# Patient Record
Sex: Female | Born: 1985 | Race: White | Hispanic: No | Marital: Married | Smoking: Current every day smoker
Health system: Southern US, Community
[De-identification: ages and names within clinical notes are randomized; demographics above are authoritative.]

## PROBLEM LIST (undated history)

## (undated) DIAGNOSIS — O1490 Unspecified pre-eclampsia, unspecified trimester: Secondary | ICD-10-CM

## (undated) DIAGNOSIS — Z9851 Tubal ligation status: Secondary | ICD-10-CM

## (undated) HISTORY — PX: TUBAL LIGATION: SHX77

---

## 2006-05-27 ENCOUNTER — Emergency Department: Payer: Self-pay | Admitting: General Practice

## 2008-08-04 ENCOUNTER — Emergency Department: Payer: Self-pay | Admitting: Emergency Medicine

## 2008-10-08 ENCOUNTER — Emergency Department: Payer: Self-pay | Admitting: Emergency Medicine

## 2009-12-08 ENCOUNTER — Emergency Department: Payer: Self-pay | Admitting: Emergency Medicine

## 2010-11-05 ENCOUNTER — Ambulatory Visit: Payer: Self-pay | Admitting: Advanced Practice Midwife

## 2011-02-25 ENCOUNTER — Emergency Department: Payer: Self-pay | Admitting: Emergency Medicine

## 2011-04-15 ENCOUNTER — Inpatient Hospital Stay: Payer: Self-pay

## 2011-04-15 LAB — PIH PROFILE
Anion Gap: 13 (ref 7–16)
BUN: 9 mg/dL (ref 7–18)
Chloride: 109 mmol/L — ABNORMAL HIGH (ref 98–107)
Creatinine: 0.64 mg/dL (ref 0.60–1.30)
EGFR (Non-African Amer.): >60
HGB: 12.3 g/dL (ref 12.0–16.0)
MCH: 30.2 pg (ref 26.0–34.0)
MCHC: 34 g/dL (ref 32.0–36.0)
Osmolality: 282 (ref 275–301)
Platelet: 198 10*3/uL (ref 150–440)
Sodium: 143 mmol/L (ref 136–145)
Uric Acid: 4.6 mg/dL (ref 2.6–6.0)

## 2011-04-15 LAB — PROTEIN / CREATININE RATIO, URINE
Creatinine, Urine: 137.9 mg/dL — ABNORMAL HIGH (ref 30.0–125.0)
Protein, Random Urine: 44 mg/dL — ABNORMAL HIGH (ref 0–12)

## 2011-04-21 LAB — PATHOLOGY REPORT

## 2012-03-06 ENCOUNTER — Encounter: Payer: Self-pay | Admitting: Obstetrics & Gynecology

## 2012-04-17 ENCOUNTER — Encounter: Payer: Self-pay | Admitting: Maternal and Fetal Medicine

## 2012-05-11 ENCOUNTER — Encounter: Payer: Self-pay | Admitting: Obstetrics and Gynecology

## 2012-06-22 ENCOUNTER — Encounter: Payer: Self-pay | Admitting: Maternal and Fetal Medicine

## 2012-09-10 ENCOUNTER — Observation Stay: Payer: Self-pay | Admitting: Obstetrics and Gynecology

## 2012-09-11 ENCOUNTER — Inpatient Hospital Stay: Payer: Self-pay

## 2012-09-11 LAB — CBC WITH DIFFERENTIAL/PLATELET
Basophil #: 0.1 10*3/uL (ref 0.0–0.1)
Basophil %: 0.9 %
Eosinophil #: 0 10*3/uL (ref 0.0–0.7)
Eosinophil %: 0.1 %
HCT: 33.2 % — ABNORMAL LOW (ref 35.0–47.0)
HGB: 11.6 g/dL — ABNORMAL LOW (ref 12.0–16.0)
Lymphocyte #: 1.6 10*3/uL (ref 1.0–3.6)
Lymphocyte %: 9.7 %
MCH: 29.3 pg (ref 26.0–34.0)
Monocyte %: 3.5 %
Neutrophil #: 13.8 10*3/uL — ABNORMAL HIGH (ref 1.4–6.5)
Neutrophil %: 85.8 %
Platelet: 215 10*3/uL (ref 150–440)
RBC: 3.95 10*6/uL (ref 3.80–5.20)
RDW: 13.3 % (ref 11.5–14.5)
WBC: 16.1 10*3/uL — ABNORMAL HIGH (ref 3.6–11.0)

## 2012-09-12 LAB — HEMATOCRIT: HCT: 28.5 % — ABNORMAL LOW (ref 35.0–47.0)

## 2012-09-12 LAB — PATHOLOGY REPORT

## 2012-10-11 ENCOUNTER — Emergency Department: Payer: Self-pay | Admitting: Emergency Medicine

## 2012-10-11 LAB — COMPREHENSIVE METABOLIC PANEL
Albumin: 3.7 g/dL (ref 3.4–5.0)
Anion Gap: 8 (ref 7–16)
BUN: 13 mg/dL (ref 7–18)
Bilirubin,Total: 0.3 mg/dL (ref 0.2–1.0)
Calcium, Total: 8.9 mg/dL (ref 8.5–10.1)
Chloride: 110 mmol/L — ABNORMAL HIGH (ref 98–107)
Co2: 27 mmol/L (ref 21–32)
EGFR (African American): >60
Potassium: 3.8 mmol/L (ref 3.5–5.1)
SGPT (ALT): 45 U/L (ref 12–78)
Total Protein: 7.2 g/dL (ref 6.4–8.2)

## 2012-10-11 LAB — URINALYSIS, COMPLETE
Bilirubin,UR: NEGATIVE
Glucose,UR: NEGATIVE mg/dL (ref 0–75)
Ketone: NEGATIVE
Nitrite: NEGATIVE
Ph: 5 (ref 4.5–8.0)
Specific Gravity: 1.024 (ref 1.003–1.030)
Squamous Epithelial: 30
WBC UR: 11 /HPF (ref 0–5)

## 2012-10-11 LAB — CBC
MCH: 27.5 pg (ref 26.0–34.0)
MCHC: 32.7 g/dL (ref 32.0–36.0)
RBC: 4.5 10*6/uL (ref 3.80–5.20)
WBC: 7.8 10*3/uL (ref 3.6–11.0)

## 2012-10-11 LAB — LIPASE, BLOOD: Lipase: 172 U/L (ref 73–393)

## 2012-10-12 LAB — WET PREP, GENITAL

## 2012-10-12 LAB — GC/CHLAMYDIA PROBE AMP

## 2012-10-20 ENCOUNTER — Emergency Department: Payer: Self-pay | Admitting: Emergency Medicine

## 2013-01-28 ENCOUNTER — Emergency Department: Payer: Self-pay | Admitting: Emergency Medicine

## 2013-01-28 LAB — COMPREHENSIVE METABOLIC PANEL
Alkaline Phosphatase: 98 U/L (ref 50–136)
BUN: 12 mg/dL (ref 7–18)
Co2: 18 mmol/L — ABNORMAL LOW (ref 21–32)
EGFR (Non-African Amer.): >60
Potassium: 3.3 mmol/L — ABNORMAL LOW (ref 3.5–5.1)
Total Protein: 6.9 g/dL (ref 6.4–8.2)

## 2013-01-28 LAB — CBC
HGB: 12.6 g/dL (ref 12.0–16.0)
MCV: 80 fL (ref 80–100)
Platelet: 206 10*3/uL (ref 150–440)
RBC: 4.48 10*6/uL (ref 3.80–5.20)
RDW: 14.5 % (ref 11.5–14.5)

## 2013-03-01 ENCOUNTER — Emergency Department: Payer: Self-pay | Admitting: Internal Medicine

## 2013-03-11 ENCOUNTER — Emergency Department: Payer: Self-pay | Admitting: Emergency Medicine

## 2013-03-30 ENCOUNTER — Emergency Department: Payer: Self-pay | Admitting: Emergency Medicine

## 2013-03-30 LAB — CBC
HCT: 38.6 % (ref 35.0–47.0)
HGB: 12.9 g/dL (ref 12.0–16.0)
MCH: 27.6 pg (ref 26.0–34.0)
MCHC: 33.5 g/dL (ref 32.0–36.0)
MCV: 82 fL (ref 80–100)
RBC: 4.68 10*6/uL (ref 3.80–5.20)
WBC: 8.8 10*3/uL (ref 3.6–11.0)

## 2013-03-30 LAB — COMPREHENSIVE METABOLIC PANEL
BUN: 15 mg/dL (ref 7–18)
Co2: 22 mmol/L (ref 21–32)
Creatinine: 0.86 mg/dL (ref 0.60–1.30)
EGFR (African American): >60
Glucose: 110 mg/dL — ABNORMAL HIGH (ref 65–99)
Osmolality: 285 (ref 275–301)
Potassium: 3.7 mmol/L (ref 3.5–5.1)
SGOT(AST): 23 U/L (ref 15–37)
SGPT (ALT): 22 U/L (ref 12–78)

## 2013-04-28 ENCOUNTER — Emergency Department: Payer: Self-pay | Admitting: Emergency Medicine

## 2013-07-18 ENCOUNTER — Emergency Department: Payer: Self-pay | Admitting: Emergency Medicine

## 2013-07-18 LAB — URINALYSIS, COMPLETE
SPECIFIC GRAVITY: 1.025 (ref 1.003–1.030)
Squamous Epithelial: NONE SEEN
WBC UR: 381 /HPF (ref 0–5)

## 2013-07-20 LAB — URINE CULTURE

## 2013-09-19 ENCOUNTER — Emergency Department: Payer: Self-pay | Admitting: Emergency Medicine

## 2013-11-20 ENCOUNTER — Emergency Department: Payer: Self-pay | Admitting: Emergency Medicine

## 2014-07-30 NOTE — Consult Note (Signed)
Referral Information:   Reason for Referral Prior pregnancy complicated by fetal growth restriction and term preeclampsia    Referring Physician [facility]    Prenatal Hx 29 year-old G3 P1011 at 74 1/7 weeks presents for genetic counseling, MFM consult and first trimester ultrasound. She declined first trimester screening. See separately dicated genetic counseling note and ultrasound report.  Marie Fowler most recent pregnancy was complicated by preeclampsia and fetal growth restriction. She states that she was found to be hypertensive during a routine ob appoitment at 37 weeks. She was sent to L&D and was found to have proteinuria (P/C 319 mg) and diagnosed with preeclampsia. She did not have HELLP syndrome. She underwent a labor induction, received magnesium prophylaxis and eventually required a cesarean delivery for arrest of dilation (documented low-transverse uterine incision).  Her delivery and post-operative course was uncomplicated. The birthweight was 5 pounds 6 ounces.  This pregnancy has been uncomplicated. She denies abdominal pain, vaginal bleeding or cramping.    Past Obstetrical Hx G3 P1011: 2009: Elective abortion at 6 weeks, D&C, no complications 2013: C-section at 37 weeks, arrest of dilation in setting of preeclampsia induction, BW 5 pounds 6 ounces   Home Medications: Medication Instructions Status  Prenatal 1 oral capsule 1 cap(s) orally once a day Active   Allergies:   Penicillin: Unknown  Vital Signs/Notes:  Nursing Vital Signs: **Vital Signs.:   25-Nov-13 13:07   Pulse Pulse 74   Systolic BP Systolic BP 127   Diastolic BP (mmHg) Diastolic BP (mmHg) 62   Perinatal Consult:   PGyn Hx Denies history of abnormal paps or STDS    Past Medical History cont'd Denies history of chronic hypertension, diabetes or thyroid disorders    PSurg Hx D&C for elective first trimester abortion, C-section    FHx See genetic counseling note    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 No edema, abdmoninal pain, cramping, vaginal bleeding.    Fever/Chills No    SOB/DOE No    Chest Pain No    Tolerating Diet Yes    Medications/Allergies Reviewed Medications/Allergies reviewed   [place]:    25-Nov-13 13:46, MFM OB US < 14 Wks, Single Fetus   MFM OB US < 14 Wks, Single Fetus    Indication: History of preeclampsia, Obesity, Close interconceptual spacing,  Exposures.   ____________________________________________________________________________  History: Age: 66 years.  ____________________________________________________________________________  Dating:  LMP:     12/12/2011     EDC:     09/17/2012     GA by LMP:     [redacted]w[redacted]d  Current Scan on:     03/06/2012     EDC:     09/19/2012     GA by current scan:      [redacted]w[redacted]d  Best Overall Assessment:     03/06/2012     EDC:     09/17/2012     Assessed GA:      [redacted]w[redacted]d  The calculation of the gestational age by current scan was based on CRL.  The Best Overall Assessment is based on the LMP.  ____________________________________________________________________________  First Trimester Scan:  Singleton gestation.  Biometry:  CRL     53.9     mm      -     [redacted]w[redacted]d    Additional Markers for Risk Assessment: Nasal bone present.  Fetal Anatomy:  Stomach:  not visible. Bladder: visible. Hands: both visible.      Fetal heart activity: present. Fetal heart rate: 157 bpm.   Placenta: anterior.     ____________________________________________________________________________  Maternal Structures:  Uterus: normal.  Cervix: normal. Cervical length: 35 mm.  Right Ovary: not visible.   Left Ovary:   Left Ovary size: 36 mm x 15 mm x 20 mm. Volume: 5.7 ml.   Right Tube: normal.   Left Tube: normal.  ____________________________________________________________________________  Report Summary:  Impression: Ms.  Marie Fowler presents for genetic counseling, MFM consultation and  ultrasound. She declined first trimester screen.    Ultrasound demonstrates a single live pregnancy at 12 1/7 weeks. Dating is  by LMP consistent with today's scan. Visualization of fetal anatomy is limted  by early gestational age.  The left ovary is visualized and contains a single  simple cyst that measures 1.9 x 1.9 x 2.0 cm. The right ovary was not able to  be visualized.    The findings were discussed. CODING DESCRIPTION: elevated BMI, prior  pregnancy with preeclampsia.   Recommendations: Return in 6 weeks for anatomy scan.    Thank you for allowing us to participate in her care.  Electronically signed by:  [place] Larsen Zettel, MD     Additional Lab/Radiology Notes Prenatal labs ([facility], 02/16/12): Blood type A positive, antibody screen neg, RPR non-reactive, Rubella immune, Hep B neg, GC/Chl neg, Varicella immune. GBS positive (urine), HIV pending   Impression/Recommendations:   Impression 29 year-old G3 P1011 at 5212 1/7 weeks with close interval pregnancy and prior pregnancy complicated by fetal growth restriction and term preeclampsia.  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    Recommendations We discussed the recurrence rate of preeclampsia and fetal growth restriction. Her preeclampsia presented at term, and though she is at risk for recurrent preeclampsia, it is reasurring that it presented at term in her prior pregnancy. We reviewed signs and symptoms of preeclampsia.  -See separately dicated genetic counseling note. She declined first trimester screening -Return in 6 weeks for anatomy scan -Can offer daily baby aspirin (81mg )  -Recommned growth scan at approximately 28 weeks in setting of prior pregnancy complicated by fetal growth restriction -Please obatin a baseline 24 hour urine protein, LFTs, and uric acid -Patient is unsure about mode of delivery. We reviewed risks and benefits of both repeat cesrean and trial  of labor -If elects for trial of labor, will need antibiotics for GBS prophylaxis.     Total Time Spent with Patient 60 minutes    >50% of visit spent in couseling/coordination of care yes    Office Use Only 99244  Level 4 (60min) NEW office consult low complexity   Coding Description: MATERNAL CONDITIONS/HISTORY INDICATION(S).   History of prior preg w/ FGR/IUGR.   OTHER: history of prior pregnancy with preeclampsia.  Electronic Signatures: Jock Mahon, [place]had (MD)  (Signed [phone]-Nov-13 16:22)  Authored: Referral, Home Medications, Allergies, Vital Signs/Notes, Consult, Exam, Radiology, Lab/Radiology Notes, Impression, Billing, Coding Description   Last Updated: 25-Nov-13 16:22 by Alec Jaros, [place]had (MD)

## 2014-08-02 NOTE — Op Note (Signed)
PATIENT NAME:  Marie Fowler, Marie Fowler MR#:  [id number] DATE OF BIRTH:  1985-08-25  DATE OF PROCEDURE:  09/11/2012  PREOPERATIVE DIAGNOSIS:  1.  Previous cesarean section, in labor.  2.  Multiparous female desiring permanent sterilization.    POSTOPERATIVE DIAGNOSIS:  1.  Previous cesarean section, in labor. 2.  Female sterilized.   PROCEDURE: Secondary LUT cesarean section.   SURGEON: Elliot Gurneyarrie C Bethsaida Siegenthaler, MD   ESTIMATED BLOOD LOSS:  1000 mL.   FINDINGS: Term liveborn female infant. Normal uterus, tubes and ovaries. Bilateral telescoping tubes.   DESCRIPTION OF PROCEDURE: The patient was taken the operating room and placed in supine position. After adequate anesthesia was instilled, the patient was prepped and draped in the usual sterile fashion. A timeout was performed and a Pfannenstiel skin incision was made through the previous skin incision, carried sharply down to the fascia. The fascia was nicked in the midline. The incision was extended in a superolateral manner with the curved Mayo scissors. Kocher clamps were applied to the fascia and the rectus fascia and muscles were sharply and bluntly dissected superiorly. The muscle belly midline was identified and separated. The peritoneum was grasped and entered. A bladder blade was placed. A bladder flap was created. A uterine incision was made. Clear fluid was noted upon rupturing of the amniotic sac. The infant's head was delivered and bulb suctioned. The remainder of the shoulders and body were delivered. The cord was milked.  The cord was clamped and cut. The infant was handed off to the awaiting pediatrician. Pitocin was started. The placenta was delivered spontaneously, intact and checked.  The uterus was exteriorized and wrapped in a moist laparotomy sponge. The interior of the uterus was curetted with a moist lap sponge. The cervix was grasped with a Pennington on the incision edge. Chromic suture was then used for a running locked suture. A running  imbricating  suture was then placed. The bladder flap was tacked back up to the uterus with a 3-0 chromic. The abdomen was cleared of clots. First the right tube and then the left tube was grasoed and infused with marcaine. the bovie was used to open the broad ligament and the sutures were placed with plain gut and the tube was excised first iont he right and then ont he left. telescoping was seen good hemostasis was seen. The uterus was placed back into the abdomen. The gutters were cleared of clots. The muscle bellies were closed with a running Vicryl suture. The On-Q pain pump was placed. The 2 trocars were placed through the fascia. The catheters were placed wrapped around the muscle bellies. The fascia was closed with a running Vicryl suture. Subcutaneous fat was approximated with anXLH on a plain gut suture. Skin clips were placed. On-Q pain pump was primed. The catheters were wrapped around a 4 x 4. Dermabond was placed on the skin.  Tegaderm was placed over the catheters. Telfa and ABD were placed over the uterine incision. The uterus was massaged and cleared of clot. Clear urine was noted in the Foley bag. The patient was laid supine and taken to recovery after having tolerated the procedure well.    ____________________________ Elliot Gurneyarrie C. Keleigh Kazee, MD cck:cb D: 09/17/2012 14:01:00 ET T: 09/17/2012 22:11:15 ET JOB#: [id number]  cc: Elliot Gurneyarrie C. Morty Ortwein, MD, <Dictator>  Elliot GurneyARRIE C Karolynn Infantino MD ELECTRONICALLY SIGNED 09/19/2012 11:57

## 2014-08-04 NOTE — Op Note (Signed)
PATIENT NAME:  Marie PaganiniMEELER, Kyesha V MR#:  [id number] DATE OF BIRTH:  19-Jan-1986  DATE OF PROCEDURE:  04/17/2011  PREOPERATIVE DIAGNOSIS: Secondary arrest of dilation.   POSTOPERATIVE DIAGNOSES:  1. Secondary arrest of dilation.  2. Meconium.  3. IUGR. 4. Small placenta.  5. OP presentation.   PROCEDURE: Primary LUT cesarean section.   SURGEON: Elliot Gurneyarrie C. Camelia Stelzner, MD  ESTIMATED BLOOD LOSS: 1500 mL.   FINDINGS: OP female fetus weighing 5 pounds, 6 ounces, Apgars of 5 and 9. Normal uterus, tubes and ovaries with small fibroid in the posterior aspect of the uterus as well as a small cervical laceration in the left lower incision.   DESCRIPTION OF PROCEDURE: Patient was taken to Operating Room, placed in supine position. After adequate epidural and spinal anesthesia was instilled, the patient was prepped and draped in the usual sterile fashion. Pfannenstiel skin incision was marked and carried sharply down to the fascia with the scalpel. Fascia was nicked in the midline and the incision was extended in a superolateral manner with curved Mayo scissors. The superior aspect of the fascia was grasped with Kocher clamps and the rectus fascia was sharply and bluntly dissected off the rectus muscles both inferiorly and superiorly. Muscle belly midline was identified and separated with the surgeon's fingers. The peritoneum was grasped and sharply entered. A bladder blade was placed. Bladder flap was created. Uterine incision was made and the infant was delivered with the assistance of vacuum due to contracted pelvis. The fetus' head was very deeply engaged in the pelvis and very difficult to get out. It was a 5-pound, 6-ounce child. The infant was found to be decreased responsiveness and was handed immediately to the pediatrician who resuscitated the baby. Blood gas was obtained which was 7.08. The uterus was wrapped in a moist laparotomy sponge after being delivered and the placenta was delivered. The interior of the  uterus was curetted with a moist laparotomy sponge. Uterine incision was closed with a running locked chromic suture imbricating suture. The bladder flap was tacked back up to the uterine incision and the pelvis and abdomen were cleared of clots. The uterus was placed back into the abdomen. The muscle bellies were approximated. The On-Q pain pump was then placed into the superior aspect of the fascia. The trocars were placed. The catheter was then placed and wrapped around the muscle belly. The fascia was closed with running chromic suture. Plain gut suture was used to approximate the subcutaneous fat and skin clips were placed on the incision. 4 x 4s were placed under the catherters and atached with  Steri-Strips the catheter to the abdomen. A large Tegaderm was placed. The On-Q pain pump was primed. The uterus was expressed of clots. Due to the previous Foley catheter pulling out the patient's legs were butterflied and intense visualization of the vagina and urethra was identified. No tears were seen. No fistulas were identified. The urine cleared once the vagina was cleared of blood and clots. The patient was then laid supine and taken to recovery after having tolerated the procedure well.   ____________________________ Elliot Gurneyarrie C. Breea Loncar, MD cck:cms D: 04/17/2011 22:16:48 ET T: 04/18/2011 14:24:39 ET JOB#: [id number]  cc: Elliot Gurneyarrie C. Daemien Fronczak, MD, <Dictator> Elliot GurneyARRIE C Keily Lepp MD ELECTRONICALLY SIGNED 04/28/2011 16:20

## 2014-08-20 NOTE — H&P (Signed)
L&D Evaluation:  History:   HPI 29 year old G2 P0010 with EDC=04/30/2011 by a 14week6day ultrasound (CRL only?)  and EDC=04/23/2011 by unsure LMP = 07/17/2010 [facility] at 37  6/7 weeks [facility] elevated BP in clinic of 142/88 and 139/91 with +1 proteinuria. Had elevated BP on 12/27 of 130/90. Deneis visual changes, headaches, abdominal pain, CP, SOB. Had had some swelling in the lower extremities. Baby active. Denies VB or LOF. Prenatal care at [facility] begun in first trimester and remarkable for obesity (current BMI=36) with a net weight loss this pregnancy of 25#, tobacco use (decreased from 1 1/2 PPD to 5cigs/day), and +GBS. Received her TDAP 02/08/2011. LABS: APOS, RI, VI, HEPB and HIV negative.    Presents with elevated BP and +1proteinuria    Patient's Medical History No Chronic Illness    Patient's Surgical History none    Medications Pre Natal Vitamins    Allergies NKDA    Social History tobacco   ROS:   ROS see HPI   Exam:   Vital Signs 153/87, 141/81, 154/72     Urine Protein 1+    General no apparent distress    Mental Status clear    Chest wheezing in left field>right    Heart normal sinus rhythm, no murmur/gallop/rubs    Abdomen gravid, non-tender    Estimated Fetal Weight 5#12 oz per biometrics with lagging AC    Fetal Position ROT    Edema 1+    Reflexes 1+    Mebranes Intact, AFI=1.1cm in LUQ    FHT 120 with accels to 140    Ucx absent    Skin dry   Impression:   Impression IUP at 37 6/7 weeks with PIH and oligohydraminos and ?IUGR   Plan:   Plan EFM/NST, PIH panel, Consult Dr Jean RosenthalJackson regarding IOL for severe preeclampsia.    Comments Began conversation with patient regarding preeclampsia and possible complications.   Electronic Signatures: Trinna BalloonGutierrez, Carie Kapuscinski L (CNM)  (Signed 03-Jan-13 11:48)  Authored: L&D Evaluation   Last Updated: 03-Jan-13 11:48 by Trinna BalloonGutierrez, Rhonda Linan L (CNM)

## 2014-08-20 NOTE — H&P (Signed)
L&D Evaluation:  History Expanded:  HPI 29yo G3P1011 previous csection who is 38 weeks 4 days, she h as a history of severe preeclampsia, she has had GBS pos urine this pregnancy, elevated one hour OGTT, desires repeat csection, and BTL has signed papers.got tdap 06/30/12   Gravida 3   Term 1   PreTerm 0   Abortion 1   Living 1   Blood Type (Maternal) A positive   Group B Strep Results Maternal (Result >5wks must be treated as unknown) positive    Maternal HIV Negative   Maternal Syphilis Ab Nonreactive   Maternal Varicella Immune   Rubella Results (Maternal) immune   Maternal T-Dap Immune   [facility]   Presents with contractions   Patient's Medical History No Chronic Illness    Patient's Surgical History Previous C-Section    Medications Pre Natal Vitamins    Allergies NKDA   Social History tobacco    Family History Non-Contributory    Current Prenatal Course Notable For obesity ho severe preeclampsia with oligo    ROS:  ROS All systems were reviewed.  HEENT, CNS, GI, GU, Respiratory, CV, Renal and Musculoskeletal systems were found to be normal.   Exam:  Vital Signs stable    Urine Protein not completed   General no apparent distress   Mental Status clear    Chest clear    Heart normal sinus rhythm   Abdomen gravid, non-tender   Estimated Fetal Weight Average for gestational age   Fundal Height term   Back no CVAT   Edema no edema    Reflexes 1+    Pelvic no external lesions   Mebranes Intact   FHT normal rate with no decels   Ucx irregular   Skin dry   Lymph no lymphadenopathy    Impression:  Impression contractions-latent labor   Plan:  Plan UA, EFM/NST, monitor contractions and for cervical change   Electronic Signatures: Adria DevonKlett, Qunisha Bryk (MD)  (Signed 01-Jun-14 17:21)  Authored: L&D Evaluation   Last Updated: 01-Jun-14 17:21 by Adria DevonKlett, Garrick Midgley (MD)

## 2014-08-20 NOTE — H&P (Signed)
L&D Evaluation:  History Expanded:  HPI 29yo G3P1011 previous csection who is 38 weeks 5 days, she has a history of severe preeclampsia, she has had GBS pos urine this pregnancy, elevated one hour OGTT, desires repeat csection, and BTL has signed papers.got tdap 06/30/12. she was here earlier tonight but contractions had stopped per mkonitor and when she wenthome they kicked back in and she presents at 3 cm dilated and contracting every 3 minutes. for repeat Csedctiona nd BTL   Gravida 3   Term 1   PreTerm 0   Abortion 1   Living 1   Blood Type (Maternal) A positive   Group B Strep Results Maternal (Result >5wks must be treated as unknown) positive   Maternal HIV Negative   Maternal Syphilis Ab Nonreactive   Maternal Varicella Immune   Rubella Results (Maternal) immune   Maternal T-Dap Immune   [facility]   Presents with contractions   Patient's Medical History No Chronic Illness   Patient's Surgical History Previous C-Section   Medications Pre Natal Vitamins   Allergies NKDA   Social History tobacco   Family History Non-Contributory   Current Prenatal Course Notable For obesity ho severe preeclampsia with oligo   ROS:  ROS All systems were reviewed.  HEENT, CNS, GI, GU, Respiratory, CV, Renal and Musculoskeletal systems were found to be normal.   Exam:  Vital Signs stable   Urine Protein not completed   General no apparent distress   Mental Status clear   Chest clear   Heart normal sinus rhythm   Abdomen gravid, non-tender   Estimated Fetal Weight Average for gestational age   Fundal Height term   Back no CVAT   Edema no edema   Reflexes 1+   Pelvic no external lesions, 3-4 cm/100   Mebranes Intact   FHT normal rate with no decels   Ucx regular   Skin dry   Lymph no lymphadenopathy   Impression:  Impression active labor, previous csection   Plan:  Plan UA, EFM/NST, monitor contractions and for cervical change    Comments will do csection with onq pain pump and btl   Follow Up Appointment need to schedule. in 6 weeks   Electronic Signatures: Adria DevonKlett, Ariyel Jeangilles (MD)  (Signed 02-Jun-14 03:09)  Authored: L&D Evaluation   Last Updated: 02-Jun-14 03:09 by Adria DevonKlett, Jennica Tagliaferri (MD)

## 2014-12-28 ENCOUNTER — Encounter: Payer: Self-pay | Admitting: Adult Health

## 2014-12-28 DIAGNOSIS — Y998 Other external cause status: Secondary | ICD-10-CM | POA: Insufficient documentation

## 2014-12-28 DIAGNOSIS — S199XXA Unspecified injury of neck, initial encounter: Secondary | ICD-10-CM | POA: Diagnosis not present

## 2014-12-28 DIAGNOSIS — Y9389 Activity, other specified: Secondary | ICD-10-CM | POA: Insufficient documentation

## 2014-12-28 DIAGNOSIS — S0990XA Unspecified injury of head, initial encounter: Secondary | ICD-10-CM | POA: Diagnosis present

## 2014-12-28 DIAGNOSIS — S0003XA Contusion of scalp, initial encounter: Secondary | ICD-10-CM | POA: Insufficient documentation

## 2014-12-28 DIAGNOSIS — Y9289 Other specified places as the place of occurrence of the external cause: Secondary | ICD-10-CM | POA: Diagnosis not present
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
## 2014-12-29 ENCOUNTER — Emergency Department
Admission: EM | Admit: 2014-12-29 | Discharge: 2014-12-29 | Disposition: A | Discharge status: Home / Self Care | Payer: BLUE CROSS/BLUE SHIELD | Attending: Emergency Medicine | Admitting: Emergency Medicine

## 2014-12-29 ENCOUNTER — Emergency Department: Payer: BLUE CROSS/BLUE SHIELD

## 2014-12-29 DIAGNOSIS — S0003XA Contusion of scalp, initial encounter: Secondary | ICD-10-CM

## 2014-12-29 DIAGNOSIS — S0990XA Unspecified injury of head, initial encounter: Secondary | ICD-10-CM

## 2014-12-29 HISTORY — DX: Tubal ligation status: Z98.51

## 2014-12-29 NOTE — ED Notes (Signed)
Pt has returned from [place] at this time. Will continue to monitor.

## 2014-12-29 NOTE — ED Notes (Signed)
Pt has finished filing a report with [employer]; moved to family waiting room; visitor with pt
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
## 2014-12-29 NOTE — Discharge Instructions (Signed)
You were seen in the Emergency Department (ED) today for a head injury.  Based on your evaluation, you may have sustained a concussion (or bruise) to your brain.  If you had a CT scan done, it did not show any evidence of serious injury or bleeding.    Symptoms to expect from a concussion include nausea, mild to moderate headache, difficulty concentrating or sleeping, and mild lightheadedness.  These symptoms should improve over the next few days to weeks, but it may take many weeks before you feel back to normal.  Return to the emergency department or follow-up with your primary care doctor if your symptoms are not improving over this time.  Signs of a more serious head injury include vomiting, severe headache, excessive sleepiness or confusion, and weakness or numbness in your face, arms or legs.  Return immediately to the Emergency Department if you experience any of these more concerning symptoms.    Rest, avoid strenuous physical or mental activity, and avoid activities that could potentially result in another head injury until all your symptoms from this head injury are completely resolved for at least 2-3 weeks.  If you participate in sports, get cleared by your doctor or trainer before returning to play.  You may take ibuprofen or acetaminophen over the counter according to label instructions for mild headache or scalp soreness.    Facial or Scalp Contusion A facial or scalp contusion is a deep bruise on the face or head. Injuries to the face and head generally cause a lot of swelling, especially around the eyes. Contusions are the result of an injury that caused bleeding under the skin. The contusion may turn blue, purple, or yellow. Minor injuries will give you a painless contusion, but more severe contusions may stay painful and swollen for a few weeks.  CAUSES  A facial or scalp contusion is caused by a blunt injury or trauma to the face or head area.  SIGNS AND SYMPTOMS   Swelling of the  injured area.   Discoloration of the injured area.   Tenderness, soreness, or pain in the injured area.  DIAGNOSIS  The diagnosis can be made by taking a medical history and doing a physical exam. An X-ray exam, CT scan, or MRI may be needed to determine if there are any associated injuries, such as broken bones (fractures). TREATMENT  Often, the best treatment for a facial or scalp contusion is applying cold compresses to the injured area. Over-the-counter medicines may also be recommended for pain control.  HOME CARE INSTRUCTIONS   Only take over-the-counter or prescription medicines as directed by your health care provider.   Apply ice to the injured area.   Put ice in a plastic bag.   Place a towel between your skin and the bag.   Leave the ice on for 20 minutes, 2-3 times a day.  SEEK MEDICAL CARE IF:  You have bite problems.   You have pain with chewing.   You are concerned about facial defects. SEEK IMMEDIATE MEDICAL CARE IF:  You have severe pain or a headache that is not relieved by medicine.   You have unusual sleepiness, confusion, or personality changes.   You throw up (vomit).   You have a persistent nosebleed.   You have double vision or blurred vision.   You have fluid drainage from your nose or ear.   You have difficulty walking or using your arms or legs.  MAKE SURE YOU:   Understand these instructions.  Will watch your condition.  Will get help right away if you are not doing well or get worse. Document Released: 05/06/2004 Document Revised: 01/17/2013 Document Reviewed: 11/09/2012 [facility] Patient Information 2015 [place], [place]. This information is not intended to replace advice given to you by your health care provider. Make sure you discuss any questions you have with your health care provider.  Head Injury You have a head injury. Headaches and throwing up (vomiting) are common after a head injury. It should be easy to wake  up from sleeping. Sometimes you must stay in the hospital. Most problems happen within the first 24 hours. Side effects may occur up to 7-10 days after the injury.  WHAT ARE THE TYPES OF HEAD INJURIES? Head injuries can be as minor as a bump. Some head injuries can be more severe. More severe head injuries include:  A jarring injury to the brain (concussion).  A bruise of the brain (contusion). This mean there is bleeding in the brain that can cause swelling.  A cracked skull (skull fracture).  Bleeding in the brain that collects, clots, and forms a bump (hematoma). WHEN SHOULD I GET HELP RIGHT AWAY?   You are confused or sleepy.  You cannot be woken up.  You feel sick to your stomach (nauseous) or keep throwing up (vomiting).  Your dizziness or unsteadiness is getting worse.  You have very bad, lasting headaches that are not helped by medicine. Take medicines only as told by your doctor.  You cannot use your arms or legs like normal.  You cannot walk.  You notice changes in the black spots in the center of the colored part of your eye (pupil).  You have clear or bloody fluid coming from your nose or ears.  You have trouble seeing. During the next 24 hours after the injury, you must stay with someone who can watch you. This person should get help right away (call 911 in the [place] if you start to shake and are not able to control it (have seizures), you pass out, or you are unable to wake up. HOW CAN I PREVENT A HEAD INJURY IN THE FUTURE?  Wear seat belts.  Wear a helmet while bike riding and playing sports like football.  Stay away from dangerous activities around the house. WHEN CAN I RETURN TO NORMAL ACTIVITIES AND ATHLETICS? See your doctor before doing these activities. You should not do normal activities or play contact sports until 1 week after the following symptoms have stopped:  Headache that does not go away.  Dizziness.  Poor  attention.  Confusion.  Memory problems.  Sickness to your stomach or throwing up.  Tiredness.  Fussiness.  Bothered by bright lights or loud noises.  Anxiousness or depression.  Restless sleep. MAKE SURE YOU:   Understand these instructions.  Will watch your condition.  Will get help right away if you are not doing well or get worse. Document Released: 03/11/2008 Document Revised: 08/13/2013 Document Reviewed: 12/04/2012 [facility] Patient Information 2015 [place], [place]. This information is not intended to replace advice given to you by your health care provider. Make sure you discuss any questions you have with your health care provider.

## 2015-05-04 ENCOUNTER — Inpatient Hospital Stay
Admission: EM | Admit: 2015-05-04 | Discharge: 2015-05-06 | DRG: 418 | Disposition: A | Discharge status: Home / Self Care | Payer: BLUE CROSS/BLUE SHIELD | Attending: General Surgery | Admitting: General Surgery

## 2015-05-04 ENCOUNTER — Emergency Department: Payer: BLUE CROSS/BLUE SHIELD

## 2015-05-04 ENCOUNTER — Other Ambulatory Visit: Payer: Self-pay

## 2015-05-04 ENCOUNTER — Encounter: Payer: Self-pay | Admitting: Emergency Medicine

## 2015-05-04 DIAGNOSIS — R079 Chest pain, unspecified: Secondary | ICD-10-CM | POA: Diagnosis present

## 2015-05-04 DIAGNOSIS — R112 Nausea with vomiting, unspecified: Secondary | ICD-10-CM | POA: Diagnosis present

## 2015-05-04 DIAGNOSIS — K810 Acute cholecystitis: Secondary | ICD-10-CM

## 2015-05-04 DIAGNOSIS — Z8249 Family history of ischemic heart disease and other diseases of the circulatory system: Secondary | ICD-10-CM | POA: Diagnosis not present

## 2015-05-04 DIAGNOSIS — F17210 Nicotine dependence, cigarettes, uncomplicated: Secondary | ICD-10-CM | POA: Diagnosis present

## 2015-05-04 DIAGNOSIS — R001 Bradycardia, unspecified: Secondary | ICD-10-CM | POA: Diagnosis present

## 2015-05-04 DIAGNOSIS — K8000 Calculus of gallbladder with acute cholecystitis without obstruction: Secondary | ICD-10-CM | POA: Diagnosis present

## 2015-05-04 DIAGNOSIS — Z833 Family history of diabetes mellitus: Secondary | ICD-10-CM

## 2015-05-04 DIAGNOSIS — Z809 Family history of malignant neoplasm, unspecified: Secondary | ICD-10-CM

## 2015-05-04 DIAGNOSIS — R1013 Epigastric pain: Secondary | ICD-10-CM

## 2015-05-04 DIAGNOSIS — Z6841 Body Mass Index (BMI) 40.0 and over, adult: Secondary | ICD-10-CM | POA: Diagnosis not present

## 2015-05-04 DIAGNOSIS — E669 Obesity, unspecified: Secondary | ICD-10-CM | POA: Diagnosis present

## 2015-05-04 HISTORY — DX: Unspecified pre-eclampsia, unspecified trimester: O14.90

## 2015-05-04 LAB — CBC
HCT: 42.3 % (ref 35.0–47.0)
Hemoglobin: 14 g/dL (ref 12.0–16.0)
MCH: 27.6 pg (ref 26.0–34.0)
MCHC: 33.1 g/dL (ref 32.0–36.0)
MCV: 83.3 fL (ref 80.0–100.0)
Platelets: 253 10*3/uL (ref 150–440)
RBC: 5.08 MIL/uL (ref 3.80–5.20)
RDW: 13.5 % (ref 11.5–14.5)
WBC: 14.4 10*3/uL — ABNORMAL HIGH (ref 3.6–11.0)

## 2015-05-04 LAB — TROPONIN I

## 2015-05-04 LAB — URINALYSIS COMPLETE WITH MICROSCOPIC (ARMC ONLY)
BILIRUBIN URINE: NEGATIVE
Glucose, UA: NEGATIVE mg/dL
LEUKOCYTES UA: NEGATIVE
Nitrite: NEGATIVE
PH: 5 (ref 5.0–8.0)
Protein, ur: 30 mg/dL — AB
SPECIFIC GRAVITY, URINE: 1.029 (ref 1.005–1.030)

## 2015-05-04 LAB — COMPREHENSIVE METABOLIC PANEL
ALT: 27 U/L (ref 14–54)
AST: 20 U/L (ref 15–41)
Albumin: 4.4 g/dL (ref 3.5–5.0)
Alkaline Phosphatase: 78 U/L (ref 38–126)
Anion gap: 8 (ref 5–15)
BUN: 14 mg/dL (ref 6–20)
CO2: 25 mmol/L (ref 22–32)
Calcium: 9.6 mg/dL (ref 8.9–10.3)
Chloride: 107 mmol/L (ref 101–111)
Creatinine, Ser: 0.89 mg/dL (ref 0.44–1.00)
GFR calc Af Amer: >60 mL/min (ref >60)
GFR calc non Af Amer: >60 mL/min (ref >60)
Glucose, Bld: 111 mg/dL — ABNORMAL HIGH (ref 65–99)
Potassium: 4 mmol/L (ref 3.5–5.1)
Sodium: 140 mmol/L (ref 135–145)
Total Bilirubin: 0.9 mg/dL (ref 0.3–1.2)
Total Protein: 7.9 g/dL (ref 6.5–8.1)

## 2015-05-04 LAB — LIPASE, BLOOD: Lipase: 23 U/L (ref 11–51)

## 2015-05-04 LAB — PREGNANCY, URINE: Preg Test, Ur: NEGATIVE

## 2015-05-04 MED ORDER — SODIUM CHLORIDE 0.9 % IV BOLUS (SEPSIS)
1000.0000 mL | Freq: Once | INTRAVENOUS | Status: AC
  Administered 2015-05-04: 1000 mL via INTRAVENOUS

## 2015-05-04 MED ORDER — MORPHINE SULFATE (PF) 4 MG/ML IV SOLN
4.0000 mg | Freq: Once | INTRAVENOUS | Status: AC
  Administered 2015-05-04: 4 mg via INTRAVENOUS
  Filled 2015-05-04: qty 1

## 2015-05-04 MED ORDER — ONDANSETRON HCL 4 MG/2ML IJ SOLN
4.0000 mg | Freq: Once | INTRAMUSCULAR | Status: AC
  Administered 2015-05-04: 4 mg via INTRAVENOUS
  Filled 2015-05-04: qty 2

## 2015-05-04 MED ORDER — ATROPINE SULFATE 0.1 MG/ML IJ SOLN
0.5000 mg | Freq: Once | INTRAMUSCULAR | Status: AC
  Administered 2015-05-04: 0.5 mg via INTRAVENOUS
  Filled 2015-05-04: qty 10

## 2015-05-04 MED ORDER — GI COCKTAIL ~~LOC~~
30.0000 mL | Freq: Once | ORAL | Status: AC
  Administered 2015-05-04: 30 mL via ORAL
  Filled 2015-05-04: qty 30

## 2015-05-04 MED ORDER — IOHEXOL 300 MG/ML  SOLN
125.0000 mL | Freq: Once | INTRAMUSCULAR | Status: AC | PRN
  Administered 2015-05-04: 125 mL via INTRAVENOUS

## 2015-05-04 MED ORDER — CIPROFLOXACIN IN D5W 400 MG/200ML IV SOLN
400.0000 mg | Freq: Two times a day (BID) | INTRAVENOUS | Status: DC
  Administered 2015-05-05 (×3): 400 mg via INTRAVENOUS
  Filled 2015-05-04 (×5): qty 200

## 2015-05-04 MED ORDER — IOHEXOL 240 MG/ML SOLN
25.0000 mL | Freq: Once | INTRAMUSCULAR | Status: AC | PRN
  Administered 2015-05-04: 25 mL via ORAL

## 2015-05-04 MED ORDER — METRONIDAZOLE IN NACL 5-0.79 MG/ML-% IV SOLN
500.0000 mg | Freq: Three times a day (TID) | INTRAVENOUS | Status: DC
  Filled 2015-05-04: qty 100

## 2015-05-04 NOTE — ED Notes (Signed)
Pt being transported to CT

## 2015-05-04 NOTE — ED Provider Notes (Addendum)
[facility] Emergency Department Provider Note  Time seen: 8:08 PM  I have reviewed the triage vital signs and the nursing notes.   HISTORY  Chief Complaint Abdominal Pain and Chest Pain    HPI Marie Fowler is a 30 y.o. female with no past medical history presents the emergency department with upper abdominal pain and nausea and vomiting. According to the patient beginning last night while she was at [facility] emergency Department with her mother (patient was not the patient last night) she developed upper abdominal discomfort. States it has worsened overnight and is now associated with nausea had one episode of vomiting today. Denies diarrhea, denies dysuria, denies lower abdominal pain. Denies any history of gastritis. Describes the pain as moderate located in the epigastrium and radiates to the left upper quadrant of the abdomen.     Past Medical History  Diagnosis Date  . H/O tubal ligation     There are no active problems to display for this patient.   Past Surgical History  Procedure Laterality Date  . Tubal ligation      No current outpatient prescriptions on file.  Allergies Review of patient's allergies indicates no known allergies.  History reviewed. No pertinent family history.  Social History Social History  Substance Use Topics  . Smoking status: Current Every Day Smoker -- 1.00 packs/day    Types: Cigarettes  . Smokeless tobacco: Never Used  . Alcohol Use: No    Review of Systems Constitutional: Negative for fever. Cardiovascular: Negative for chest pain. Respiratory: Negative for shortness of breath. Gastrointestinal: Epigastric pain, left upper quadrant pain. Positive for nausea, one of the vomiting. Negative for diarrhea. Negative for black or bloody stool. Genitourinary: Negative for dysuria. Musculoskeletal: Negative for back pain. Neurological: Negative for headache 10-point ROS otherwise  negative.  ____________________________________________   PHYSICAL EXAM:  VITAL SIGNS: ED Triage Vitals  Enc Vitals Group     BP 05/04/15 1736 132/87 mmHg     Pulse Rate 05/04/15 1736 61     Resp 05/04/15 1736 18     Temp 05/04/15 1736 98 F (36.7 C)     Temp Source 05/04/15 1736 Oral     SpO2 05/04/15 1736 100 %     Weight 05/04/15 1736 249 lb (112.946 kg)     Height 05/04/15 1736  (1.651 m)     Head Cir --      Peak Flow --      Pain Score 05/04/15 1756 7     Pain Loc --      Pain Edu? --      Excl. in GC? --     Constitutional: Alert and oriented. Well appearing and in no distress. Eyes: Normal exam ENT   Head: Normocephalic and atraumatic   Mouth/Throat: Mucous membranes are moist. Cardiovascular: Normal rate, regular rhythm. No murmur Respiratory: Normal respiratory effort without tachypnea nor retractions. Breath sounds are clear and equal bilaterally. No wheezes/rales/rhonchi. Gastrointestinal: Soft, mild epigastric tenderness palpation. No rebound or guarding. No distention. No right upper quadrant tenderness. Musculoskeletal: Nontender with normal range of motion in all extremities. Neurologic:  Normal speech and language. No gross focal neurologic deficits Skin:  Skin is warm, dry and intact.  Psychiatric: Mood and affect are normal. Speech and behavior are normal. ____________________________________________    EKG  EKG reviewed and interpreted by myself shows sinus bradycardia at 41 bpm, narrow QS, normal axis, normal intervals, no ST changes.  ____________________________________________    RADIOLOGY  CT shows signs of possible cholecystitis.  ____________________________________________   INITIAL IMPRESSION / ASSESSMENT AND PLAN / ED COURSE  Pertinent labs & imaging results that were available during my care of the patient were reviewed by me and considered in my medical decision making (see chart for details).  Patient presents  with upper abdominal pain, nausea and vomiting. Patient has mild epigastric tenderness palpation. No rebound or guarding. No right upper quadrant tenderness. Labs are largely within normal limits besides a mild leukocytosis. We will dose GI cocktail and normal saline. The patient does not improve we will proceed with CT abdomen/pelvis further evaluate. Patient agreeable plan.  Patient continues to have epigastric pain after GI cocktail administration. We will proceed to CT abdomen/pelvis to further evaluate. Patient remains bradycardic record review shows no history of bradycardia in the past.  Given the patient's persistent bradycardia around 40 bpm I discussed patient with Dr.Khan. He recommends trying 0.5 mg of atropine, states he doubts that it is related to her pain/discomfort but could be a vasovagal response. We will dose atropine and continue to monitor in the emergency department. Currently awaiting CT scan. He states if the patient is able to be discharged home he will see her tomorrow at 3 PM in the office.  CT shows signs of possible cholecystitis. We'll proceed with parenteral quadrant to further evaluate. This could possibly be causing diaphragmatic irritation which could be leading to her bradycardia. Patient has received atropine and her heart rate is currently around 80 bpm. Continues to have discomfort. States it is mild currently.  Patient states abdominal pain is coming back. She vomited after drinking CT contrast. We will dose morphine and Zofran. I discussed with Dr. Ludwig Lean will be down to see the patient. Ultrasound is ordered.  Surgery has seen the patient, believe the patient likely has cholecystitis and will need her gallbladder out however given her bradycardia we will have medicine admit for cardiology clearance. Dr. Ludwig Lean has ordered antibiotics for the patient. ____________________________________________   FINAL CLINICAL IMPRESSION(S) / ED DIAGNOSES  Epigastric  pain Acute cholecystitis Minna Antis, MD 05/04/15 2249  Minna Antis, MD 05/04/15 2259

## 2015-05-04 NOTE — ED Notes (Signed)
Pt c/o nausea, emesis bag given. Pt vomited large amount of fluid. Bed sheets and gown changed. Assisted with clean-up by Reece Leader MD notified.

## 2015-05-04 NOTE — H&P (Signed)
Marie Fowler is an 30 y.o. female.   Chief Complaint: epigastric to RUQ pain HPI:  30 yr old female with complaint of epigastric to right upper quadrant pain starting yesterday afternoon. Patient states that she ate calzone and then began feeling as if gas was building up in her upper abdomen.  Patient states that it then moved over to her right upper abdomen and that the pain increasingly got worse. Patient felt as if she was constipated and that gas was trying to move but she was unable.  Patient states that she then began to fill as if she had a fever and chills however she did not take her temperature. Patient states that she began feeling nauseated and did not have an appetite as well. Patient states that she did vomit once before coming to the hospital and once after getting pain medicine. Patient states that her mother and aunt have had to have their gallbladders removed previously. Patient was bradycardic whenever she first came in however she denies any feelings of chest pain shortness of breath weakness or dizziness. Patient does state that both her mother and her aunt had heart attacks in their 70s.  Past Medical History  Diagnosis Date  . H/O tubal ligation   . Preeclampsia   . Preeclampsia     Past Surgical History  Procedure Laterality Date  . Tubal ligation    . Cesarean section      Family History  Problem Relation Age of Onset  . CAD    . Hyperlipidemia Mother   . Heart disease Mother   . Heart attack Mother 67  . Heart disease Maternal Aunt   . Hyperlipidemia Maternal Aunt   . Heart attack Maternal Aunt 40  . Cancer Maternal Grandmother   . Diabetes Paternal Grandmother    Social History:  reports that she has been smoking Cigarettes.  She has been smoking about 1.00 pack per day. She has never used smokeless tobacco. She reports that she does not drink alcohol or use illicit drugs.  Allergies: No Known Allergies   (Not in a hospital admission)  Results for  orders placed or performed during the hospital encounter of 05/04/15 (from the past 48 hour(s))  CBC     Status: Abnormal   Collection Time: 05/04/15  6:02 PM  Result Value Ref Range   WBC 14.4 (H) 3.6 - 11.0 K/uL   RBC 5.08 3.80 - 5.20 MIL/uL   Hemoglobin 14.0 12.0 - 16.0 g/dL   HCT 42.3 35.0 - 47.0 %   MCV 83.3 80.0 - 100.0 fL   MCH 27.6 26.0 - 34.0 pg   MCHC 33.1 32.0 - 36.0 g/dL   RDW 13.5 11.5 - 14.5 %   Platelets 253 150 - 440 K/uL  Troponin I     Status: None   Collection Time: 05/04/15  6:02 PM  Result Value Ref Range   Troponin I <0.03 <0.031 ng/mL    Comment:        NO INDICATION OF MYOCARDIAL INJURY.   Lipase, blood     Status: None   Collection Time: 05/04/15  6:02 PM  Result Value Ref Range   Lipase 23 11 - 51 U/L  Comprehensive metabolic panel     Status: Abnormal   Collection Time: 05/04/15  6:02 PM  Result Value Ref Range   Sodium 140 135 - 145 mmol/L   Potassium 4.0 3.5 - 5.1 mmol/L   Chloride 107 101 - 111 mmol/L  CO2 25 22 - 32 mmol/L   Glucose, Bld 111 (H) 65 - 99 mg/dL   BUN 14 6 - 20 mg/dL   Creatinine, Ser 0.89 0.44 - 1.00 mg/dL   Calcium 9.6 8.9 - 10.3 mg/dL   Total Protein 7.9 6.5 - 8.1 g/dL   Albumin 4.4 3.5 - 5.0 g/dL   AST 20 15 - 41 U/L   ALT 27 14 - 54 U/L   Alkaline Phosphatase 78 38 - 126 U/L   Total Bilirubin 0.9 0.3 - 1.2 mg/dL   GFR calc non Af Amer >60 >60 mL/min   GFR calc Af Amer >60 >60 mL/min    Comment: (NOTE) The eGFR has been calculated using the CKD EPI equation. This calculation has not been validated in all clinical situations. eGFR's persistently <60 mL/min signify possible Chronic Kidney Disease.    Anion gap 8 5 - 15  Urinalysis complete, with microscopic ([facility] only)     Status: Abnormal   Collection Time: 05/04/15  6:02 PM  Result Value Ref Range   Color, Urine YELLOW (A) YELLOW   APPearance HAZY (A) CLEAR   Glucose, UA NEGATIVE NEGATIVE mg/dL   Bilirubin Urine NEGATIVE NEGATIVE   Ketones, ur TRACE (A)  NEGATIVE mg/dL   Specific Gravity, Urine 1.029 1.005 - 1.030   Hgb urine dipstick 3+ (A) NEGATIVE   pH 5.0 5.0 - 8.0   Protein, ur 30 (A) NEGATIVE mg/dL   Nitrite NEGATIVE NEGATIVE   Leukocytes, UA NEGATIVE NEGATIVE   RBC / HPF 6-30 0 - 5 RBC/hpf   WBC, UA 6-30 0 - 5 WBC/hpf   Bacteria, UA RARE (A) NONE SEEN   Squamous Epithelial / LPF 0-5 (A) NONE SEEN   Mucous PRESENT   Pregnancy, urine     Status: None   Collection Time: 05/04/15  6:02 PM  Result Value Ref Range   Preg Test, Ur NEGATIVE NEGATIVE   Dg Chest 2 View  05/04/2015  CLINICAL DATA:  Left upper quadrant, left lateral chest pain today, smoker EXAM: CHEST  2 VIEW COMPARISON:  01/28/2013 FINDINGS: The heart size and mediastinal contours are within normal limits. Both lungs are clear. The visualized skeletal structures are unremarkable. IMPRESSION: No active cardiopulmonary disease. Electronically Signed   By: Skipper Cliche M.D.   On: 05/04/2015 18:28   Ct Abdomen Pelvis W Contrast  05/04/2015  CLINICAL DATA:  Diffuse abdominal pain extending to the chest, sensation of fecal urgency, vomiting with diarrhea EXAM: CT ABDOMEN AND PELVIS WITH CONTRAST TECHNIQUE: Multidetector CT imaging of the abdomen and pelvis was performed using the standard protocol following bolus administration of intravenous contrast. CONTRAST:  134m OMNIPAQUE IOHEXOL 300 MG/ML  SOLN COMPARISON:  10/12/2012 FINDINGS: Lower chest: Mild hazy attenuation in a mosaic pattern at both lung bases likely atelectasis. Subpleural 4 mm nodule on the left stable from 2014 and therefore considered to be benign. Hepatobiliary: Multiple gallstones. Evidence of gallbladder wall thickening and possible mild pericholecystic inflammation. Liver is normal. No significant biliary dilatation. Pancreas: Negative Spleen: Negative Adrenals/Urinary Tract: Negative Stomach/Bowel: Normal.  No significant fecal retention. Vascular/Lymphatic: Normal Reproductive: Normal Other: No free fluid  Musculoskeletal: No acute findings IMPRESSION: Gallstones with evidence of possible cholecystitis. Suggest right upper quadrant ultrasound to evaluate further. Electronically Signed   By: RSkipper ClicheM.D.   On: 05/04/2015 21:57    Review of Systems  Constitutional: Positive for fever, chills and malaise/fatigue. Negative for weight loss and diaphoresis.  HENT: Negative for congestion and sore  throat.   Respiratory: Negative for cough, shortness of breath and wheezing.   Cardiovascular: Negative for chest pain, palpitations and leg swelling.  Gastrointestinal: Positive for heartburn, nausea, vomiting and abdominal pain. Negative for diarrhea, constipation and blood in stool.  Genitourinary: Negative for dysuria, frequency, hematuria and flank pain.  Musculoskeletal: Negative for back pain and neck pain.  Skin: Negative for itching and rash.  Neurological: Positive for weakness. Negative for dizziness, focal weakness, loss of consciousness and headaches.  Psychiatric/Behavioral: Negative for depression. The patient is not nervous/anxious.   All other systems reviewed and are negative.   Blood pressure 128/72, pulse 44, temperature 98 F (36.7 C), temperature source Oral, resp. rate 19, height '5\' 5"'$  (1.651 m), weight 249 lb (112.946 kg), last menstrual period 05/01/2015, SpO2 99 %. Physical Exam  Vitals reviewed. Constitutional: She is oriented to person, place, and time. She appears well-developed and well-nourished. No distress.  obese  HENT:  Head: Normocephalic and atraumatic.  Right Ear: External ear normal.  Left Ear: External ear normal.  Nose: Nose normal.  Mouth/Throat: Oropharynx is clear and moist.  Poor dentition   Eyes: Conjunctivae and EOM are normal. Pupils are equal, round, and reactive to light. No scleral icterus.  Neck: Normal range of motion. Neck supple. No tracheal deviation present.  Cardiovascular: Normal heart sounds and intact distal pulses.  Exam reveals  no gallop and no friction rub.   No murmur heard. Bradycardic, regular rhythm but not always correlating with distal pulse, no murmur or gallop appreciated   Respiratory: Effort normal and breath sounds normal. No respiratory distress. She has no wheezes. She has no rales.  GI: Soft. Bowel sounds are normal. She exhibits distension. There is tenderness. There is no rebound and no guarding.  Moderate Epigastric and RUQ pain, + Murphy's sign on exam, no CVA tenderness   Musculoskeletal: Normal range of motion. She exhibits no edema or tenderness.  Neurological: She is alert and oriented to person, place, and time. No cranial nerve deficit.  Skin: Skin is warm and dry. No rash noted. No erythema. No pallor.  Psychiatric: She has a normal mood and affect. Her behavior is normal. Judgment and thought content normal.     Assessment/Plan 30 yr old female with acute cholecystitis and bradycardia.  I personally reviewed her past medical history including her times coming in for her C-sections previously and specifically looking at her heart rate where it was normal in the past. Personally reviewed her laboratory values noting elevated white blood cell count was normal LFTs. I personally reviewed her CT scan images showing a thickened inflamed gallbladder wall with some fluid surrounding. I have also reviewed the radiology results as above.  The risks, benefits, complications, treatment options, and expected outcomes were discussed with the patient. The possibilities of bleeding, recurrent infection, finding a normal gallbladder, perforation of viscus organs, damage to surrounding structures, bile leak, abscess formation, needing a drain placed, the need for additional procedures, reaction to medication, pulmonary aspiration,  failure to diagnose a condition, the possible need to convert to an open procedure, and creating a complication requiring transfusion or operation were discussed with the patient.      I also discussed that given her bradycardia and family history of MI in their 39s, would be concerned about an increased risk for MI, stroke, blood clots and death with anesthesia.  I have discussed with hospitalist, Dr. Jannifer Franklin, who will evaluate her and admit pending cardiac clearance.   Once clearance obtained we  should be able to do Lap chole this admission.  NPO, IV fluids and cipro/flagyl for antibiotics given PCN allergy.      Lavona Mound Ariona Deschene 05/04/2015, 11:39 PM

## 2015-05-04 NOTE — H&P (Addendum)
[facility] [place] at [facility]   PATIENT NAME: Marie Fowler    MR#:  [id number]  DATE OF BIRTH:  January 30, 1986  DATE OF ADMISSION:  05/04/2015  PRIMARY CARE PHYSICIAN: No PCP Per Patient   REQUESTING/REFERRING PHYSICIAN: Lenard Lance, MD  CHIEF COMPLAINT:   Chief Complaint  Patient presents with  . Abdominal Pain  . Chest Pain    HISTORY OF PRESENT ILLNESS:  Marie Fowler  is a 30 y.o. female who presents with right upper quadrant abdominal pain. She states that she has had this pain for about one day, associated with nausea and vomiting, has gotten progressively worse, and nothing has seemed to improved. On evaluation in the ED she was found to have likely acute cholecystitis and gallstones on CT scan. Surgery was consult to feels like she likely needs cholecystectomy. However, patient was also bradycardic on arrival here the heart rate persistently 40s. Sinus bradycardia. Cardiology was called by the ED physician and recommended giving her atropine. This brought her heart rate up to the 70s and 80s. However, she has significant family history of heart disease and heart attacks, and surgery requested that hospitalists admit.  PAST MEDICAL HISTORY:   Past Medical History  Diagnosis Date  . H/O tubal ligation   . Preeclampsia   . Preeclampsia     PAST SURGICAL HISTORY:   Past Surgical History  Procedure Laterality Date  . Tubal ligation    . Cesarean section      SOCIAL HISTORY:   Social History  Substance Use Topics  . Smoking status: Current Every Day Smoker -- 1.00 packs/day    Types: Cigarettes  . Smokeless tobacco: Never Used  . Alcohol Use: No    FAMILY HISTORY:   Family History  Problem Relation Age of Onset  . CAD    . Hyperlipidemia Mother   . Heart disease Mother   . Heart attack Mother 11  . Heart disease Maternal Aunt   . Hyperlipidemia Maternal Aunt   . Heart attack Maternal Aunt 40  . Cancer Maternal Grandmother   .  Diabetes Paternal Grandmother     DRUG ALLERGIES:  No Known Allergies  MEDICATIONS AT HOME:   Prior to Admission medications   Not on File    REVIEW OF SYSTEMS:  Review of Systems  Constitutional: Negative for fever, chills, weight loss and malaise/fatigue.  HENT: Negative for ear pain, hearing loss and tinnitus.   Eyes: Negative for blurred vision, double vision, pain and redness.  Respiratory: Negative for cough, hemoptysis and shortness of breath.   Cardiovascular: Negative for chest pain, palpitations, orthopnea and leg swelling.  Gastrointestinal: Positive for nausea, vomiting and abdominal pain. Negative for diarrhea and constipation.  Genitourinary: Negative for dysuria, frequency and hematuria.  Musculoskeletal: Negative for back pain, joint pain and neck pain.  Skin:       No acne, rash, or lesions  Neurological: Negative for dizziness, tremors, focal weakness and weakness.  Endo/Heme/Allergies: Negative for polydipsia. Does not bruise/bleed easily.  Psychiatric/Behavioral: Negative for depression. The patient is not nervous/anxious and does not have insomnia.      VITAL SIGNS:   Filed Vitals:   05/04/15 2100 05/04/15 2130 05/04/15 2214 05/04/15 2215  BP: 123/80 127/75 128/72   Pulse: 42 41 45 44  Temp:      TempSrc:      Resp: Height:      Weight:      SpO2: 100% 99% 100%  99%   Wt Readings from Last 3 Encounters:  05/04/15 112.946 kg (249 lb)    PHYSICAL EXAMINATION:  Physical Exam  Vitals reviewed. Constitutional: She is oriented to person, place, and time. She appears well-developed and well-nourished. No distress.  HENT:  Head: Normocephalic and atraumatic.  Mouth/Throat: Oropharynx is clear and moist.  Eyes: Conjunctivae and EOM are normal. Pupils are equal, round, and reactive to light. No scleral icterus.  Neck: Normal range of motion. Neck supple. No JVD present. No thyromegaly present.  Cardiovascular: Normal rate, regular rhythm  and intact distal pulses.  Exam reveals no gallop and no friction rub.   Murmur (2/6 soft systolic murmur) heard. Respiratory: Effort normal and breath sounds normal. No respiratory distress. She has no wheezes. She has no rales.  GI: Soft. Bowel sounds are normal. She exhibits no distension. There is tenderness (right upper quadrant).  Musculoskeletal: Normal range of motion. She exhibits no edema.  No arthritis, no gout  Lymphadenopathy:    She has no cervical adenopathy.  Neurological: She is alert and oriented to person, place, and time. No cranial nerve deficit.  No dysarthria, no aphasia  Skin: Skin is warm and dry. No rash noted. No erythema.  Psychiatric: She has a normal mood and affect. Her behavior is normal. Judgment and thought content normal.    LABORATORY PANEL:   CBC  Recent Labs Lab 05/04/15 1802  WBC 14.4*  HGB 14.0  HCT 42.3  PLT 253   ------------------------------------------------------------------------------------------------------------------  Chemistries   Recent Labs Lab 05/04/15 1802  NA 140  K 4.0  CL 107  CO2 25  GLUCOSE 111*  BUN 14  CREATININE 0.89  CALCIUM 9.6  AST 20  ALT 27  ALKPHOS 78  BILITOT 0.9   ------------------------------------------------------------------------------------------------------------------  Cardiac Enzymes  Recent Labs Lab 05/04/15 1802  TROPONINI <0.03   ------------------------------------------------------------------------------------------------------------------  RADIOLOGY:  Dg Chest 2 View  05/04/2015  CLINICAL DATA:  Left upper quadrant, left lateral chest pain today, smoker EXAM: CHEST  2 VIEW COMPARISON:  01/28/2013 FINDINGS: The heart size and mediastinal contours are within normal limits. Both lungs are clear. The visualized skeletal structures are unremarkable. IMPRESSION: No active cardiopulmonary disease. Electronically Signed   By: Esperanza Heir M.D.   On: 05/04/2015 18:28   Ct  Abdomen Pelvis W Contrast  05/04/2015  CLINICAL DATA:  Diffuse abdominal pain extending to the chest, sensation of fecal urgency, vomiting with diarrhea EXAM: CT ABDOMEN AND PELVIS WITH CONTRAST TECHNIQUE: Multidetector CT imaging of the abdomen and pelvis was performed using the standard protocol following bolus administration of intravenous contrast. CONTRAST:  OMNIPAQUE IOHEXOL 300 MG/ML  SOLN COMPARISON:  10/12/2012 FINDINGS: Lower chest: Mild hazy attenuation in a mosaic pattern at both lung bases likely atelectasis. Subpleural 4 mm nodule on the left stable from 2014 and therefore considered to be benign. Hepatobiliary: Multiple gallstones. Evidence of gallbladder wall thickening and possible mild pericholecystic inflammation. Liver is normal. No significant biliary dilatation. Pancreas: Negative Spleen: Negative Adrenals/Urinary Tract: Negative Stomach/Bowel: Normal.  No significant fecal retention. Vascular/Lymphatic: Normal Reproductive: Normal Other: No free fluid Musculoskeletal: No acute findings IMPRESSION: Gallstones with evidence of possible cholecystitis. Suggest right upper quadrant ultrasound to evaluate further. Electronically Signed   By: Esperanza Heir M.D.   On: 05/04/2015 21:57    EKG:   Orders placed or performed during the hospital encounter of 05/04/15  . ED EKG  . ED EKG    IMPRESSION AND PLAN:  Principal Problem:   Acute  cholecystitis - surgery following, defer to their recommendation for surgical need. IV antibiotics for now, Cipro and Flagyl. Active Problems:   Bradycardia - continues atropine when necessary to maintain her heart rate. Cardiology consult, echocardiogram ordered. We'll trend cardiac enzymes tonight, first was negative.   Nausea and vomiting - when necessary antiemetics  All the records are reviewed and case discussed with ED provider. Management plans discussed with the patient and/or family.  DVT PROPHYLAXIS: SubQ lovenox  GI PROPHYLAXIS:  None  ADMISSION STATUS: Inpatient  CODE STATUS: Full Code Status History    This patient does not have a recorded code status. Please follow your organizational policy for patients in this situation.      TOTAL TIME TAKING CARE OF THIS PATIENT: 45 minutes.    Marquasha Brutus FIELDING 05/04/2015, 11:49 PM  [facility]  Office  [phone]  CC: Primary care physician; No PCP Per Patient

## 2015-05-04 NOTE — ED Notes (Signed)
Pt states she went to [facility] yesterday when she had what she felt was gas building like she needed to poop.Pt states abdominal pain extends up to her chest. Pt states she tried to wait it out but it just got worse. Pt states decreased appetite at this time. Pt states she has been vomiting and having diarrhea. Upon arrival pt's HR noted to be consistently in the 40's.

## 2015-05-05 ENCOUNTER — Inpatient Hospital Stay
Admit: 2015-05-05 | Discharge: 2015-05-05 | Disposition: A | Discharge status: Home / Self Care | Payer: BLUE CROSS/BLUE SHIELD | Attending: General Surgery | Admitting: General Surgery

## 2015-05-05 ENCOUNTER — Inpatient Hospital Stay: Payer: BLUE CROSS/BLUE SHIELD | Admitting: Certified Registered"

## 2015-05-05 ENCOUNTER — Inpatient Hospital Stay: Admit: 2015-05-05 | Payer: BLUE CROSS/BLUE SHIELD

## 2015-05-05 ENCOUNTER — Encounter: Payer: Self-pay | Admitting: *Deleted

## 2015-05-05 ENCOUNTER — Inpatient Hospital Stay: Payer: BLUE CROSS/BLUE SHIELD

## 2015-05-05 ENCOUNTER — Encounter
Admission: EM | Disposition: A | Discharge status: Home / Self Care | Payer: Self-pay | Source: Home / Self Care | Attending: General Surgery

## 2015-05-05 DIAGNOSIS — R112 Nausea with vomiting, unspecified: Secondary | ICD-10-CM | POA: Diagnosis present

## 2015-05-05 HISTORY — PX: CHOLECYSTECTOMY: SHX55

## 2015-05-05 LAB — BASIC METABOLIC PANEL
ANION GAP: 7 (ref 5–15)
BUN: 11 mg/dL (ref 6–20)
CALCIUM: 8.3 mg/dL — AB (ref 8.9–10.3)
CO2: 23 mmol/L (ref 22–32)
Chloride: 109 mmol/L (ref 101–111)
Creatinine, Ser: 0.79 mg/dL (ref 0.44–1.00)
GFR calc Af Amer: >60 mL/min (ref >60)
GFR calc non Af Amer: >60 mL/min (ref >60)
GLUCOSE: 128 mg/dL — AB (ref 65–99)
POTASSIUM: 3.7 mmol/L (ref 3.5–5.1)
Sodium: 139 mmol/L (ref 135–145)

## 2015-05-05 LAB — TROPONIN I
Troponin I: <0.03 ng/mL (ref <0.031)
Troponin I: <0.03 ng/mL (ref <0.031)
Troponin I: <0.03 ng/mL (ref <0.031)

## 2015-05-05 LAB — CBC
HEMATOCRIT: 38.4 % (ref 35.0–47.0)
HEMOGLOBIN: 12.8 g/dL (ref 12.0–16.0)
MCH: 28.1 pg (ref 26.0–34.0)
MCHC: 33.5 g/dL (ref 32.0–36.0)
MCV: 83.9 fL (ref 80.0–100.0)
Platelets: 240 10*3/uL (ref 150–440)
RBC: 4.58 MIL/uL (ref 3.80–5.20)
RDW: 13.4 % (ref 11.5–14.5)
WBC: 13.4 10*3/uL — ABNORMAL HIGH (ref 3.6–11.0)

## 2015-05-05 SURGERY — LAPAROSCOPIC CHOLECYSTECTOMY
Anesthesia: General

## 2015-05-05 MED ORDER — ROCURONIUM BROMIDE 100 MG/10ML IV SOLN
INTRAVENOUS | Status: DC | PRN
  Administered 2015-05-05 (×2): 20 mg via INTRAVENOUS
  Administered 2015-05-05: 10 mg via INTRAVENOUS

## 2015-05-05 MED ORDER — METRONIDAZOLE IN NACL 5-0.79 MG/ML-% IV SOLN
500.0000 mg | Freq: Three times a day (TID) | INTRAVENOUS | Status: DC
  Administered 2015-05-05 – 2015-05-06 (×3): 500 mg via INTRAVENOUS
  Filled 2015-05-05 (×8): qty 100

## 2015-05-05 MED ORDER — HYDROMORPHONE HCL 1 MG/ML IJ SOLN
INTRAMUSCULAR | Status: AC
  Administered 2015-05-05: 0.25 mg via INTRAVENOUS
  Filled 2015-05-05: qty 1

## 2015-05-05 MED ORDER — HYDROMORPHONE HCL 1 MG/ML IJ SOLN
0.2500 mg | INTRAMUSCULAR | Status: DC | PRN
  Administered 2015-05-05: 0.5 mg via INTRAVENOUS
  Administered 2015-05-05: 0.25 mg via INTRAVENOUS
  Administered 2015-05-05: 0.5 mg via INTRAVENOUS
  Administered 2015-05-05 (×3): 0.25 mg via INTRAVENOUS

## 2015-05-05 MED ORDER — ONDANSETRON HCL 4 MG/2ML IJ SOLN
4.0000 mg | Freq: Four times a day (QID) | INTRAMUSCULAR | Status: DC | PRN
  Administered 2015-05-05: 4 mg via INTRAVENOUS
  Filled 2015-05-05: qty 2

## 2015-05-05 MED ORDER — ENOXAPARIN SODIUM 40 MG/0.4ML ~~LOC~~ SOLN
40.0000 mg | Freq: Two times a day (BID) | SUBCUTANEOUS | Status: DC
  Administered 2015-05-05 – 2015-05-06 (×3): 40 mg via SUBCUTANEOUS
  Filled 2015-05-05 (×3): qty 0.4

## 2015-05-05 MED ORDER — EPHEDRINE SULFATE 50 MG/ML IJ SOLN
INTRAMUSCULAR | Status: DC | PRN
  Administered 2015-05-05: 10 mg via INTRAVENOUS

## 2015-05-05 MED ORDER — SODIUM CHLORIDE 0.9 % IJ SOLN
3.0000 mL | Freq: Two times a day (BID) | INTRAMUSCULAR | Status: DC
Start: 2015-05-05 — End: 2015-05-06
  Administered 2015-05-05: 3 mL via INTRAVENOUS

## 2015-05-05 MED ORDER — LACTATED RINGERS IV SOLN
INTRAVENOUS | Status: DC
  Administered 2015-05-05 – 2015-05-06 (×3): via INTRAVENOUS

## 2015-05-05 MED ORDER — PHENYLEPHRINE HCL 10 MG/ML IJ SOLN
INTRAMUSCULAR | Status: DC | PRN
  Administered 2015-05-05 (×4): 100 ug via INTRAVENOUS

## 2015-05-05 MED ORDER — DEXAMETHASONE SODIUM PHOSPHATE 10 MG/ML IJ SOLN
INTRAMUSCULAR | Status: DC | PRN
  Administered 2015-05-05: 10 mg via INTRAVENOUS

## 2015-05-05 MED ORDER — PROPOFOL 10 MG/ML IV BOLUS
INTRAVENOUS | Status: DC | PRN
  Administered 2015-05-05: 150 mg via INTRAVENOUS

## 2015-05-05 MED ORDER — ACETAMINOPHEN 325 MG PO TABS
650.0000 mg | ORAL_TABLET | Freq: Four times a day (QID) | ORAL | Status: DC | PRN
  Administered 2015-05-05: 650 mg via ORAL
  Filled 2015-05-05: qty 2

## 2015-05-05 MED ORDER — ONDANSETRON HCL 4 MG PO TABS: 4.0000 mg | ORAL_TABLET | Freq: Four times a day (QID) | ORAL | Status: DC | PRN

## 2015-05-05 MED ORDER — FENTANYL CITRATE (PF) 100 MCG/2ML IJ SOLN
INTRAMUSCULAR | Status: DC | PRN
  Administered 2015-05-05: 50 ug via INTRAVENOUS
  Administered 2015-05-05: 25 ug via INTRAVENOUS
  Administered 2015-05-05: 100 ug via INTRAVENOUS
  Administered 2015-05-05: 25 ug via INTRAVENOUS

## 2015-05-05 MED ORDER — ONDANSETRON HCL 4 MG/2ML IJ SOLN
INTRAMUSCULAR | Status: DC | PRN
  Administered 2015-05-05: 4 mg via INTRAVENOUS

## 2015-05-05 MED ORDER — ACETAMINOPHEN 650 MG RE SUPP: 650.0000 mg | Freq: Four times a day (QID) | RECTAL | Status: DC | PRN

## 2015-05-05 MED ORDER — HYDROCODONE-ACETAMINOPHEN 5-325 MG PO TABS
1.0000 | ORAL_TABLET | ORAL | Status: DC | PRN
  Administered 2015-05-05: 2 via ORAL
  Administered 2015-05-05: 1 via ORAL
  Administered 2015-05-06: 2 via ORAL
  Filled 2015-05-05 (×3): qty 2

## 2015-05-05 MED ORDER — MIDAZOLAM HCL 2 MG/2ML IJ SOLN
INTRAMUSCULAR | Status: DC | PRN
  Administered 2015-05-05: 2 mg via INTRAVENOUS

## 2015-05-05 MED ORDER — SUCCINYLCHOLINE CHLORIDE 20 MG/ML IJ SOLN
INTRAMUSCULAR | Status: DC | PRN
  Administered 2015-05-05: 100 mg via INTRAVENOUS

## 2015-05-05 MED ORDER — METOCLOPRAMIDE HCL 5 MG/ML IJ SOLN: 10.0000 mg | Freq: Once | INTRAMUSCULAR | Status: DC | PRN

## 2015-05-05 MED ORDER — LIDOCAINE HCL (CARDIAC) 20 MG/ML IV SOLN
INTRAVENOUS | Status: DC | PRN
  Administered 2015-05-05: 100 mg via INTRAVENOUS

## 2015-05-05 MED ORDER — SUGAMMADEX SODIUM 500 MG/5ML IV SOLN
INTRAVENOUS | Status: DC | PRN
  Administered 2015-05-05: 225 mg via INTRAVENOUS

## 2015-05-05 MED ORDER — MORPHINE SULFATE (PF) 4 MG/ML IV SOLN
4.0000 mg | INTRAVENOUS | Status: DC | PRN
  Administered 2015-05-05 – 2015-05-06 (×5): 4 mg via INTRAVENOUS
  Filled 2015-05-05 (×5): qty 1

## 2015-05-05 MED ORDER — BUPIVACAINE HCL 0.5 % IJ SOLN
INTRAMUSCULAR | Status: DC | PRN
  Administered 2015-05-05: 16 mL

## 2015-05-05 MED ORDER — GLYCOPYRROLATE 0.2 MG/ML IJ SOLN
INTRAMUSCULAR | Status: DC | PRN
  Administered 2015-05-05: 0.1 mg via INTRAVENOUS

## 2015-05-05 MED ORDER — CIPROFLOXACIN IN D5W 400 MG/200ML IV SOLN
INTRAVENOUS | Status: AC
  Administered 2015-05-05: 400 mg via INTRAVENOUS
  Filled 2015-05-05: qty 200

## 2015-05-05 MED ORDER — LIDOCAINE HCL (PF) 1 % IJ SOLN
INTRAMUSCULAR | Status: DC | PRN
  Administered 2015-05-05: 16 mL

## 2015-05-05 SURGICAL SUPPLY — 46 items
APPLIER CLIP ROT 10 11.4 M/L (STAPLE) ×3
BAG COUNTER SPONGE EZ (MISCELLANEOUS) ×2 IMPLANT
BLADE SURG SZ11 CARB STEEL (BLADE) ×3 IMPLANT
BULB RESERV EVAC DRAIN JP 100C (MISCELLANEOUS) IMPLANT
CANISTER SUCT 1200ML W/VALVE (MISCELLANEOUS) ×3 IMPLANT
CATH CHOLANG 76X19 KUMAR (CATHETERS) IMPLANT
CHLORAPREP W/TINT 26ML (MISCELLANEOUS) ×3 IMPLANT
CLIP APPLIE ROT 10 11.4 M/L (STAPLE) ×1 IMPLANT
CLOSURE WOUND 1/2 X4 (GAUZE/BANDAGES/DRESSINGS)
CONRAY 60ML FOR OR (MISCELLANEOUS) IMPLANT
COUNTER SPONGE BAG EZ (MISCELLANEOUS) ×1
DISSECTOR KITTNER STICK (MISCELLANEOUS) ×1 IMPLANT
DISSECTORS/KITTNER STICK (MISCELLANEOUS) ×3
DRAIN CHANNEL JP 19F (MISCELLANEOUS) IMPLANT
DRAPE SHEET LG 3/4 BI-LAMINATE (DRAPES) ×3 IMPLANT
ELECT REM PT RETURN 9FT ADLT (ELECTROSURGICAL) ×3
ELECTRODE REM PT RTRN 9FT ADLT (ELECTROSURGICAL) ×1 IMPLANT
GAUZE SPONGE 4X4 12PLY STRL (GAUZE/BANDAGES/DRESSINGS) ×3 IMPLANT
GLOVE BIO SURGEON STRL SZ7.5 (GLOVE) ×3 IMPLANT
GLOVE INDICATOR 8.0 STRL GRN (GLOVE) ×3 IMPLANT
GOWN STRL REUS W/ TWL LRG LVL3 (GOWN DISPOSABLE) ×3 IMPLANT
GOWN STRL REUS W/TWL LRG LVL3 (GOWN DISPOSABLE) ×6
IRRIGATION STRYKERFLOW (MISCELLANEOUS) ×1 IMPLANT
IRRIGATOR STRYKERFLOW (MISCELLANEOUS) ×3
IV NS 1000ML (IV SOLUTION) ×2
IV NS 1000ML BAXH (IV SOLUTION) ×1 IMPLANT
L-HOOK LAP DISP 36CM (ELECTROSURGICAL) ×3
LABEL OR SOLS (LABEL) IMPLANT
LHOOK LAP DISP 36CM (ELECTROSURGICAL) ×1 IMPLANT
NEEDLE HYPO 25X1 1.5 SAFETY (NEEDLE) ×3 IMPLANT
NEEDLE VERESS 14GA 120MM (NEEDLE) ×3 IMPLANT
NS IRRIG 500ML POUR BTL (IV SOLUTION) ×3 IMPLANT
PACK LAP CHOLECYSTECTOMY (MISCELLANEOUS) ×3 IMPLANT
PENCIL ELECTRO HAND CTR (MISCELLANEOUS) ×3 IMPLANT
POUCH ENDO CATCH 10MM SPEC (MISCELLANEOUS) ×3 IMPLANT
SCISSORS METZENBAUM CVD 33 (INSTRUMENTS) IMPLANT
SLEEVE ENDOPATH XCEL 5M (ENDOMECHANICALS) ×6 IMPLANT
STRIP CLOSURE SKIN 1/2X4 (GAUZE/BANDAGES/DRESSINGS) IMPLANT
SUT MNCRL 4-0 (SUTURE) ×4
SUT MNCRL 4-0 27XMFL (SUTURE) ×2
SUT VICRYL 0 AB UR-6 (SUTURE) ×3 IMPLANT
SUTURE MNCRL 4-0 27XMF (SUTURE) ×2 IMPLANT
SWABSTK COMLB BENZOIN TINCTURE (MISCELLANEOUS) ×3 IMPLANT
TROCAR XCEL 12X100 BLDLESS (ENDOMECHANICALS) ×3 IMPLANT
TROCAR XCEL NON-BLD 5MMX100MML (ENDOMECHANICALS) ×3 IMPLANT
TUBING INSUFFLATOR HI FLOW (MISCELLANEOUS) ×3 IMPLANT

## 2015-05-05 NOTE — Consult Note (Signed)
[facility] Cardiology Consultation Note  Patient ID: Marie Fowler, MRN: [id number], DOB/AGE: May 24, 1985 30 y.o. Admit date: 05/04/2015    Date of Consult: 05/05/2015 Primary Physician: No PCP Per Patient Primary Cardiologist: None  Chief Complaint:  Chief Complaint  Patient presents with  . Abdominal Pain  . Chest Pain   Reason for Consult: bradycardia  HPI: 30 y.o. female with no evidence of previous cardiovascular history who is had acute onset of nausea vomiting and significant abdominal pain radiating into her back. The patient has had slight dehydration and weakness with this but no evidence of significant syncope dizziness or other significant cardiac symptoms. The patient previously has been able to do all the physical activity she wishes including exercise as well as walking without evidence of symptoms of chest pain shortness of breath at this time. The patient overall has had some improvements with her admission and has had no evidence of heart block. Telemetry has shown normal sinus rhythm for most of the time but periods of sinus bradycardia and no evidence of heart block. The patient does have an EKG also showing sinus bradycardia otherwise normal EKG  Past Medical History  Diagnosis Date  . H/O tubal ligation   . Preeclampsia   . Preeclampsia       Surgical History:  Past Surgical History  Procedure Laterality Date  . Tubal ligation    . Cesarean section       Home Meds: Prior to Admission medications   Not on File    Inpatient Medications:  . ciprofloxacin  400 mg Intravenous Q12H  . enoxaparin (LOVENOX) injection  40 mg Subcutaneous Q12H  . metronidazole  500 mg Intravenous Q8H  . sodium chloride  3 mL Intravenous Q12H      Allergies: No Known Allergies  Social History   Social History  . Marital Status: Married    Spouse Name: N/A  . Number of Children: N/A  . Years of Education: N/A   Occupational History  . Not on file.   Social  History Main Topics  . Smoking status: Current Every Day Smoker -- 1.00 packs/day    Types: Cigarettes  . Smokeless tobacco: Never Used  . Alcohol Use: No  . 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 Sexual Activity: Not on file   Other Topics Concern  . Not on file   Social History Narrative     Family History  Problem Relation Age of Onset  . CAD    . Hyperlipidemia Mother   . Heart disease Mother   . Heart attack Mother 13  . Heart disease Maternal Aunt   . Hyperlipidemia Maternal Aunt   . Heart attack Maternal Aunt 40  . Cancer Maternal Grandmother   . Diabetes Paternal Grandmother      Review of Systems Positive for weakness fatigue and nausea vomiting Negative for: General:  chills, fever, night sweats or weight changes.  Cardiovascular: PND orthopnea syncope dizziness  Dermatological skin lesions rashes Respiratory: Cough congestion Urologic: Frequent urination urination at night and hematuria Abdominal: Positive for nausea, vomiting, negative for diarrhea, bright red blood per rectum, melena, or hematemesis Neurologic: negative for visual changes, and/or hearing changes  All other systems reviewed and are otherwise negative except as noted above.  Labs:  Recent Labs  05/04/15 1802 05/05/15 0138  TROPONINI <0.03 <0.03   Lab Results  Component Value Date   WBC  13.4* 05/05/2015   HGB 12.8 05/05/2015   HCT 38.4 05/05/2015   MCV 83.9 05/05/2015   PLT 240 05/05/2015    Recent Labs Lab 05/04/15 1802 05/05/15 0138  NA 140 139  K 4.0 3.7  CL 107 109  CO2 25 23  BUN 14 11  CREATININE 0.89 0.79  CALCIUM 9.6 8.3*  PROT 7.9  --   BILITOT 0.9  --   ALKPHOS 78  --   ALT 27  --   AST 20  --   GLUCOSE 111* 128*   No results found for: CHOL, HDL, LDLCALC, TRIG No results found for: DDIMER  Radiology/Studies:  Dg Chest 2 View  05/04/2015  CLINICAL DATA:  Left upper quadrant, left lateral chest pain  today, smoker EXAM: CHEST  2 VIEW COMPARISON:  01/28/2013 FINDINGS: The heart size and mediastinal contours are within normal limits. Both lungs are clear. The visualized skeletal structures are unremarkable. IMPRESSION: No active cardiopulmonary disease. Electronically Signed   By: Esperanza Heir M.D.   On: 05/04/2015 18:28   Ct Abdomen Pelvis W Contrast  05/04/2015  CLINICAL DATA:  Diffuse abdominal pain extending to the chest, sensation of fecal urgency, vomiting with diarrhea EXAM: CT ABDOMEN AND PELVIS WITH CONTRAST TECHNIQUE: Multidetector CT imaging of the abdomen and pelvis was performed using the standard protocol following bolus administration of intravenous contrast. CONTRAST:  OMNIPAQUE IOHEXOL 300 MG/ML  SOLN COMPARISON:  10/12/2012 FINDINGS: Lower chest: Mild hazy attenuation in a mosaic pattern at both lung bases likely atelectasis. Subpleural 4 mm nodule on the left stable from 2014 and therefore considered to be benign. Hepatobiliary: Multiple gallstones. Evidence of gallbladder wall thickening and possible mild pericholecystic inflammation. Liver is normal. No significant biliary dilatation. Pancreas: Negative Spleen: Negative Adrenals/Urinary Tract: Negative Stomach/Bowel: Normal.  No significant fecal retention. Vascular/Lymphatic: Normal Reproductive: Normal Other: No free fluid Musculoskeletal: No acute findings IMPRESSION: Gallstones with evidence of possible cholecystitis. Suggest right upper quadrant ultrasound to evaluate further. Electronically Signed   By: Esperanza Heir M.D.   On: 05/04/2015 21:57    EKG: Sinus bradycardia with poor R rate progression otherwise normal  Weights: [employer]   05/04/15 1736  Weight: 249 lb (112.946 kg)     Physical Exam: Blood pressure 93/54, pulse 43, temperature 97.8 F (36.6 C), temperature source Oral, resp. rate 20, height  (1.651 m), weight 249 lb (112.946 kg), last menstrual period 05/01/2015, SpO2 95 %. Body mass  index is 41.44 kg/(m^2). General: Well developed, well nourished, in no acute distress. Head eyes ears nose throat: Normocephalic, atraumatic, sclera non-icteric, no xanthomas, nares are without discharge. No apparent thyromegaly and/or mass  Lungs: Normal respiratory effort.  no wheezes, no rales, no rhonchi.  Heart: RRR with normal S1 S2. no murmur gallop, no rub, PMI is normal size and placement, carotid upstroke normal without bruit, jugular venous pressure is normal Abdomen: Soft, non-tender, non-distended with normoactive bowel sounds. No hepatomegaly. No rebound/guarding. No obvious abdominal masses. Abdominal aorta is normal size without bruit Extremities: No edema. no cyanosis, no clubbing, no ulcers  Peripheral : 2+ bilateral upper extremity pulses, 2+ bilateral femoral pulses, 2+ bilateral dorsal pedal pulse Neuro: Alert and oriented. No facial asymmetry. No focal deficit. Moves all extremities spontaneously. Musculoskeletal: Normal muscle tone without kyphosis Psych:  Responds to questions appropriately with a normal affect.    Assessment: 30 year old female with nausea vomiting and gastric type issues without evidence of significant cardiac symptoms having a symptomatic bradycardia evidence of  heart block  Plan: 1. No further cardiac workup of apparent asymptomatic bradycardia 2. Continue telemetry while the patient is here following for any new issues of heart block and/or symptomatic bradycardia 3. Ambulation and follow for any new symptoms 4. Okay for discharge to home from cardiac standpoint with follow-up for possible symptomatic bradycardia at a later date  Signed, Lamar Blinks M.D. [facility] Cardiology 05/05/2015, 7:32 AM

## 2015-05-05 NOTE — Transfer of Care (Signed)
Immediate Anesthesia Transfer of Care Note  Patient: Marie Fowler  Procedure(s) Performed: Procedure(s): LAPAROSCOPIC CHOLECYSTECTOMY (N/A)  Patient Location: PACU  Anesthesia Type:General  Level of Consciousness: awake, alert , oriented and patient cooperative  Airway & Oxygen Therapy: Patient Spontanous Breathing and Patient connected to face mask oxygen  Post-op Assessment: Report given to RN, Post -op Vital signs reviewed and stable and Patient moving all extremities X 4  Post vital signs: Reviewed and stable  Last Vitals:  Filed Vitals:   05/05/15 0424 05/05/15 1134  BP: 93/54 106/62  Pulse: 43 55  Temp: 36.6 C 36.1 C  Resp: 20 18    Complications: No apparent anesthesia complications

## 2015-05-05 NOTE — Anesthesia Procedure Notes (Signed)
Procedure Name: Intubation Date/Time: 05/05/2015 12:08 PM Performed by: Michaele Offer Pre-anesthesia Checklist: Patient identified, Emergency Drugs available, Suction available, Patient being monitored and Timeout performed Patient Re-evaluated:Patient Re-evaluated prior to inductionOxygen Delivery Method: Circle system utilized Preoxygenation: Pre-oxygenation with 100% oxygen Intubation Type: IV induction Ventilation: Mask ventilation without difficulty Laryngoscope Size: Mac and 3 Grade View: Grade I Tube type: Oral Tube size: 7.0 mm Number of attempts: 1 Airway Equipment and Method: Rigid stylet Placement Confirmation: ETT inserted through vocal cords under direct vision,  positive ETCO2 and breath sounds checked- equal and bilateral Secured at: 20 cm Tube secured with: Tape Dental Injury: Teeth and Oropharynx as per pre-operative assessment

## 2015-05-05 NOTE — Op Note (Signed)
Laparoscopic Cholecystectomy  Pre-operative Diagnosis: Acute Cholecystitis   Post-operative Diagnosis: Same  Procedure: Laparoscopic Cholecystectomy  Surgeon: Leonette Most T. Tonita Cong, MD FACS  Anesthesia: Gen. with endotracheal tube  Assistant:None  Procedure Details  The patient was seen again in the Holding Room. The benefits, complications, treatment options, and expected outcomes were discussed with the patient. The risks of bleeding, infection, recurrence of symptoms, failure to resolve symptoms, bile duct damage, bile duct leak, retained common bile duct stone, bowel injury, any of which could require further surgery and/or ERCP, stent, or papillotomy were reviewed with the patient. The likelihood of improving the patient's symptoms with return to their baseline status is good.  The patient and/or family concurred with the proposed plan, giving informed consent.  The patient was taken to Operating Room, identified as Marie Fowler and the procedure verified as Laparoscopic Cholecystectomy.  A Time Out was held and the above information confirmed.  Prior to the induction of general anesthesia, antibiotic prophylaxis was administered. VTE prophylaxis was in place. General endotracheal anesthesia was then administered and tolerated well. After the induction, the abdomen was prepped with Chloraprep and draped in the sterile fashion. The patient was positioned in the supine position.  Local anesthetic  was injected into the skin near the umbilicus and an incision made. The Veress needle was placed. Pneumoperitoneum was then created with CO2 and tolerated well without any adverse changes in the patient's vital signs. A 5mm port was placed in the periumbilical position and the abdominal cavity was explored.  Two 5-mm ports were placed in the right upper quadrant and a 12 mm epigastric port was placed all under direct vision. All skin incisions  were infiltrated with a local anesthetic agent before  making the incision and placing the trocars.   The patient was positioned  in reverse Trendelenburg, tilted slightly to the patient's left.  The gallbladder was identified, the fundus grasped and retracted cephalad. Adhesions were lysed bluntly. The infundibulum was grasped and retracted laterally, exposing the peritoneum overlying the triangle of Calot. This was then divided and exposed in a blunt fashion. A critical view of the cystic duct and cystic artery was obtained.  The cystic duct was clearly identified and bluntly dissected.   There were thick inflammatory adhesions over the entire the gallbladder and the triangle of Chloe. These were taken down primarily with blunt dissection. The area was very friable secondary to the inflammation.  The gallbladder was taken from the gallbladder fossa in a retrograde fashion with the electrocautery. The gallbladder was removed and placed in an Endocatch bag. The liver bed was irrigated and inspected. Hemostasis was achieved with the electrocautery. Copious irrigation was utilized and was repeatedly aspirated until clear.  The gallbladder and Endocatch sac were then removed through the epigastric port site.   The epigastric port site and the fascial opening had to be opened sharply and bluntly in order to retrieve the gallbladder due to the size. The bag was able be removed without rupture or spillage of contents in the trocar site.  Inspection of the right upper quadrant was performed. No bleeding, bile duct injury or leak, or bowel injury was noted. Pneumoperitoneum was released.  The epigastric port site was closed with figure-of-eight 0 Vicryl sutures. 4-0 subcuticular Monocryl was used to close the skin. Steristrips and Mastisol and sterile dressings were  applied.  The patient was then extubated and brought to the recovery room in stable condition. Sponge, lap, and needle counts were correct at closure  and at the conclusion of the case.    Findings: Acute Cholecystitis   Estimated Blood Loss: 50 mL         Drains: None         Specimens: Gallbladder           Complications: none               Kimorah Ridolfi T. Tonita Cong, MD, FACS

## 2015-05-05 NOTE — Progress Notes (Signed)
Patient admitted overnight with acute cholecystitis. Patient also with bradycardia. Evaluated by cardiology who advises to proceed with surgery for her cholecystectomy.  I discussed the procedure in detail.  We discussed the risks and benefits of a laparoscopic cholecystectomy and possible cholangiogram including, but not limited to bleeding, infection, injury to surrounding structures such as the intestine or liver, bile leak, retained gallstones, need to convert to an open procedure, prolonged diarrhea, blood clots such as  DVT, common bile duct injury, anesthesia risks, and possible need for additional procedures.  The likelihood of improvement in symptoms and return to the patient's normal status is good. We discussed the typical post-operative recovery course.  Patient wishes to proceed. Plan for laparoscopic cholecystectomy later today.  Ricarda Frame, MD FACS General Surgeon [facility]

## 2015-05-05 NOTE — ED Notes (Signed)
Pt will remain in ER for [place] per Dr. Anne Hahn. [place] called and has one pt in line before her. MD made aware.

## 2015-05-05 NOTE — Brief Op Note (Signed)
05/04/2015 - 05/05/2015  1:48 PM  PATIENT:  Marie Fowler  30 y.o. female  PRE-OPERATIVE DIAGNOSIS:  cholecystitis  POST-OPERATIVE DIAGNOSIS:  cholecystitis  PROCEDURE:  Procedure(s): LAPAROSCOPIC CHOLECYSTECTOMY (N/A)  SURGEON:  Surgeon(s) and Role:    * Ricarda Frame, MD - Primary  PHYSICIAN ASSISTANT:   ASSISTANTS: none   ANESTHESIA:   general  EBL:  Total I/O In: 750 [I.V.:750] Out: 30 [Blood:30]  BLOOD ADMINISTERED:none  DRAINS: none   LOCAL MEDICATIONS USED:  MARCAINE   , XYLOCAINE  and Amount: 30 ml  SPECIMEN:  Source of Specimen:  gallbladder  DISPOSITION OF SPECIMEN:  PATHOLOGY  COUNTS:  YES  TOURNIQUET:  * No tourniquets in log *  DICTATION: .Dragon Dictation  PLAN OF CARE: return to inpatient  PATIENT DISPOSITION:  PACU - hemodynamically stable.   Delay start of Pharmacological VTE agent (>24hrs) due to surgical blood loss or risk of bleeding: no

## 2015-05-05 NOTE — Progress Notes (Signed)
Marie Fowler is a 30 y.o. female  [id number]  Primary Cardiologist: Adrian Blackwater Reason for Consultation: Bradycardia  HPI: This is a 30 year old white female with no prior medical history presented to the emergency room with abdominal pain and chest pain EKG showed sinus bradycardia I was asked to evaluate the patient last night and was told that the heart rate persistently is 35-45. Advise 0.5 mg of atropine   Review of Systems: Right now is still having some abdominal pain   Past Medical History  Diagnosis Date  . H/O tubal ligation   . Preeclampsia   . Preeclampsia     No prescriptions prior to admission     . ciprofloxacin  400 mg Intravenous Q12H  . enoxaparin (LOVENOX) injection  40 mg Subcutaneous Q12H  . metronidazole  500 mg Intravenous Q8H  . sodium chloride  3 mL Intravenous Q12H    Infusions:    No Known Allergies  Social History   Social History  . Marital Status: Married    Spouse Name: N/A  . Number of Children: N/A  . Years of Education: N/A   Occupational History  . Not on file.   Social History Main Topics  . Smoking status: Current Every Day Smoker -- 1.00 packs/day    Types: Cigarettes  . Smokeless tobacco: Never Used  . Alcohol Use: No  . 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 Sexual Activity: Not on file   Other Topics Concern  . Not on file   Social History Narrative    Family History  Problem Relation Age of Onset  . CAD    . Hyperlipidemia Mother   . Heart disease Mother   . Heart attack Mother 34  . Heart disease Maternal Aunt   . Hyperlipidemia Maternal Aunt   . Heart attack Maternal Aunt 40  . Cancer Maternal Grandmother   . Diabetes Paternal Grandmother     PHYSICAL EXAM: Filed Vitals:   05/05/15 0125 05/05/15 0424  BP: 117/76 93/54  Pulse: 51 43  Temp: 98.5 F (36.9 C) 97.8 F (36.6 C)  Resp: 18 20     Intake/Output Summary (Last 24 hours) at  05/05/15 0834 Last data filed at 05/05/15 0600  Gross per 24 hour  Intake    100 ml  Output      0 ml  Net    100 ml    General:  Well appearing. No respiratory difficulty HEENT: normal Neck: supple. no JVD. Carotids 2+ bilat; no bruits. No lymphadenopathy or thryomegaly appreciated. Cor: PMI nondisplaced. Regular rate & rhythm. No rubs, gallops or murmurs. Lungs: clear Abdomen: soft, nontender, nondistended. No hepatosplenomegaly. No bruits or masses. Good bowel sounds. Extremities: no cyanosis, clubbing, rash, edema Neuro: alert & oriented x 3, cranial nerves grossly intact. moves all 4 extremities w/o difficulty. Affect pleasant.  ECG: Anus bradycardia with nonspecific T-wave inversion in the anterior leads heart rate 41  Results for orders placed or performed during the hospital encounter of 05/04/15 (from the past 24 hour(s))  CBC     Status: Abnormal   Collection Time: 05/04/15  6:02 PM  Result Value Ref Range   WBC 14.4 (H) 3.6 - 11.0 K/uL   RBC 5.08 3.80 - 5.20 MIL/uL   Hemoglobin 14.0 12.0 - 16.0 g/dL   HCT 11.9 14.7 - 82.9 %   MCV 83.3 80.0 - 100.0 fL  MCH 27.6 26.0 - 34.0 pg   MCHC 33.1 32.0 - 36.0 g/dL   RDW 16.1 09.6 - 04.5 %   Platelets 253 150 - 440 K/uL  Troponin I     Status: None   Collection Time: 05/04/15  6:02 PM  Result Value Ref Range   Troponin I <0.03 <0.031 ng/mL  Lipase, blood     Status: None   Collection Time: 05/04/15  6:02 PM  Result Value Ref Range   Lipase 23 11 - 51 U/L  Comprehensive metabolic panel     Status: Abnormal   Collection Time: 05/04/15  6:02 PM  Result Value Ref Range   Sodium 140 135 - 145 mmol/L   Potassium 4.0 3.5 - 5.1 mmol/L   Chloride 107 101 - 111 mmol/L   CO2 25 22 - 32 mmol/L   Glucose, Bld 111 (H) 65 - 99 mg/dL   BUN 14 6 - 20 mg/dL   Creatinine, Ser 4.09 0.44 - 1.00 mg/dL   Calcium 9.6 8.9 - 81.1 mg/dL   Total Protein 7.9 6.5 - 8.1 g/dL   Albumin 4.4 3.5 - 5.0 g/dL   AST 20 15 - 41 U/L   ALT 27 14 - 54  U/L   Alkaline Phosphatase 78 38 - 126 U/L   Total Bilirubin 0.9 0.3 - 1.2 mg/dL   GFR calc non Af Amer >60 >60 mL/min   GFR calc Af Amer >60 >60 mL/min   Anion gap 8 5 - 15  Urinalysis complete, with microscopic ([facility] only)     Status: Abnormal   Collection Time: 05/04/15  6:02 PM  Result Value Ref Range   Color, Urine YELLOW (A) YELLOW   APPearance HAZY (A) CLEAR   Glucose, UA NEGATIVE NEGATIVE mg/dL   Bilirubin Urine NEGATIVE NEGATIVE   Ketones, ur TRACE (A) NEGATIVE mg/dL   Specific Gravity, Urine 1.029 1.005 - 1.030   Hgb urine dipstick 3+ (A) NEGATIVE   pH 5.0 5.0 - 8.0   Protein, ur 30 (A) NEGATIVE mg/dL   Nitrite NEGATIVE NEGATIVE   Leukocytes, UA NEGATIVE NEGATIVE   RBC / HPF 6-30 0 - 5 RBC/hpf   WBC, UA 6-30 0 - 5 WBC/hpf   Bacteria, UA RARE (A) NONE SEEN   Squamous Epithelial / LPF 0-5 (A) NONE SEEN   Mucous PRESENT   Pregnancy, urine     Status: None   Collection Time: 05/04/15  6:02 PM  Result Value Ref Range   Preg Test, Ur NEGATIVE NEGATIVE  Basic metabolic panel     Status: Abnormal   Collection Time: 05/05/15  1:38 AM  Result Value Ref Range   Sodium 139 135 - 145 mmol/L   Potassium 3.7 3.5 - 5.1 mmol/L   Chloride 109 101 - 111 mmol/L   CO2 23 22 - 32 mmol/L   Glucose, Bld 128 (H) 65 - 99 mg/dL   BUN 11 6 - 20 mg/dL   Creatinine, Ser 9.14 0.44 - 1.00 mg/dL   Calcium 8.3 (L) 8.9 - 10.3 mg/dL   GFR calc non Af Amer >60 >60 mL/min   GFR calc Af Amer >60 >60 mL/min   Anion gap 7 5 - 15  CBC     Status: Abnormal   Collection Time: 05/05/15  1:38 AM  Result Value Ref Range   WBC 13.4 (H) 3.6 - 11.0 K/uL   RBC 4.58 3.80 - 5.20 MIL/uL   Hemoglobin 12.8 12.0 - 16.0 g/dL   HCT 38.4  35.0 - 47.0 %   MCV 83.9 80.0 - 100.0 fL   MCH 28.1 26.0 - 34.0 pg   MCHC 33.5 32.0 - 36.0 g/dL   RDW 13.2 44.0 - 10.2 %   Platelets 240 150 - 440 K/uL  Troponin I     Status: None   Collection Time: 05/05/15  1:38 AM  Result Value Ref Range   Troponin I <0.03 <0.031 ng/mL   Troponin I     Status: None   Collection Time: 05/05/15  7:30 AM  Result Value Ref Range   Troponin I <0.03 <0.031 ng/mL   Dg Chest 2 View  05/04/2015  CLINICAL DATA:  Left upper quadrant, left lateral chest pain today, smoker EXAM: CHEST  2 VIEW COMPARISON:  01/28/2013 FINDINGS: The heart size and mediastinal contours are within normal limits. Both lungs are clear. The visualized skeletal structures are unremarkable. IMPRESSION: No active cardiopulmonary disease. Electronically Signed   By: Esperanza Heir M.D.   On: 05/04/2015 18:28   Ct Abdomen Pelvis W Contrast  05/04/2015  CLINICAL DATA:  Diffuse abdominal pain extending to the chest, sensation of fecal urgency, vomiting with diarrhea EXAM: CT ABDOMEN AND PELVIS WITH CONTRAST TECHNIQUE: Multidetector CT imaging of the abdomen and pelvis was performed using the standard protocol following bolus administration of intravenous contrast. CONTRAST:  OMNIPAQUE IOHEXOL 300 MG/ML  SOLN COMPARISON:  10/12/2012 FINDINGS: Lower chest: Mild hazy attenuation in a mosaic pattern at both lung bases likely atelectasis. Subpleural 4 mm nodule on the left stable from 2014 and therefore considered to be benign. Hepatobiliary: Multiple gallstones. Evidence of gallbladder wall thickening and possible mild pericholecystic inflammation. Liver is normal. No significant biliary dilatation. Pancreas: Negative Spleen: Negative Adrenals/Urinary Tract: Negative Stomach/Bowel: Normal.  No significant fecal retention. Vascular/Lymphatic: Normal Reproductive: Normal Other: No free fluid Musculoskeletal: No acute findings IMPRESSION: Gallstones with evidence of possible cholecystitis. Suggest right upper quadrant ultrasound to evaluate further. Electronically Signed   By: Esperanza Heir M.D.   On: 05/04/2015 21:57     ASSESSMENT AND PLAN: Sinus bradycardia in the 40s associated with gallbladder disease with right upper quadrant tenderness. Patient is found to have  gallstones and may need cholecystectomy. Advise proceeding with surgery and if needed but may use atropine. Patient is young and the probably is having vasovagal response due to pain in the right upper quadrant.  Jared Whorley A

## 2015-05-05 NOTE — Progress Notes (Signed)
Urine bloody per nursing student, Dr. Sherryll Burger aware. Patient stating she is on her period. No new orders, will continue to monitor.

## 2015-05-05 NOTE — Progress Notes (Signed)
ANTIBIOTIC CONSULT NOTE - INITIAL  Pharmacy Consult for ciprofloxacin Indication: IAI  No Known Allergies  Patient Measurements: Height:  (165.1 cm) Weight: 249 lb (112.946 kg) IBW/kg (Calculated) : 57 Adjusted Body Weight:   Vital Signs: Temp: 98.5 F (36.9 C) (01/23 0125) Temp Source: Oral (01/23 0125) BP: 117/76 mmHg (01/23 0125) Pulse Rate: 51 (01/23 0125) Intake/Output from previous day:   Intake/Output from this shift:    Labs:  Recent Labs  05/04/15 1802  WBC 14.4*  HGB 14.0  PLT 253  CREATININE 0.89   Estimated Creatinine Clearance: 113.7 mL/min (by C-G formula based on Cr of 0.89). No results for input(s): VANCOTROUGH, VANCOPEAK, VANCORANDOM, GENTTROUGH, GENTPEAK, GENTRANDOM, TOBRATROUGH, TOBRAPEAK, TOBRARND, AMIKACINPEAK, AMIKACINTROU, AMIKACIN in the last 72 hours.   Microbiology: No results found for this or any previous visit (from the past 720 hour(s)).  Medical History: Past Medical History  Diagnosis Date  . H/O tubal ligation   . Preeclampsia   . Preeclampsia     Medications:  Infusions:   Assessment: 30 yof cc abdominal pain/chest pain associated with N/V duration approx 1 day progressively worsening. CT found cholecystitis and gallstones, surgery consulted. Pharmacy consulted to dose ciprofloxacin for IAI.  Goal of Therapy:    Plan:  Ciprofloxacin 400 mg IV Q12H.   Carola Frost, Pharm.D., BCPS Clinical Pharmacist 05/05/2015,1:44 AM

## 2015-05-05 NOTE — Anesthesia Preprocedure Evaluation (Signed)
Anesthesia Evaluation  Patient identified by MRN, date of birth, ID band Patient awake    Reviewed: Allergy & Precautions, NPO status , Patient's Chart, lab work & pertinent test results  Airway Mallampati: II  TM Distance: >3 FB Neck ROM: Full    Dental  (+) Poor Dentition   Pulmonary Current Smoker,    Pulmonary exam normal        Cardiovascular Exercise Tolerance: Good hypertension, Normal cardiovascular exam  Was bradycardic today into the 40s and has a significant family hx of CAD. However, she works at [employer] and is on her feet--walking, for most of the day.   Neuro/Psych    GI/Hepatic Acute cholecystitis and cholelithiasis.   Endo/Other  Morbid obesity  Renal/GU      Musculoskeletal   Abdominal (+) + obese,   Peds  Hematology   Anesthesia Other Findings   Reproductive/Obstetrics                             Anesthesia Physical Anesthesia Plan  ASA: III and emergent  Anesthesia Plan: General   Post-op Pain Management:    Induction: Intravenous  Airway Management Planned: Oral ETT  Additional Equipment:   Intra-op Plan:   Post-operative Plan: Extubation in OR  Informed Consent: I have reviewed the patients History and Physical, chart, labs and discussed the procedure including the risks, benefits and alternatives for the proposed anesthesia with the patient or authorized representative who has indicated his/her understanding and acceptance.   Dental advisory given  Plan Discussed with: CRNA  Anesthesia Plan Comments:         Anesthesia Quick Evaluation

## 2015-05-05 NOTE — Anesthesia Postprocedure Evaluation (Signed)
Anesthesia Post Note  Patient: Marie Fowler  Procedure(s) Performed: Procedure(s) (LRB): LAPAROSCOPIC CHOLECYSTECTOMY (N/A)  Patient location during evaluation: PACU Anesthesia Type: General Level of consciousness: awake and alert Pain management: pain level controlled Vital Signs Assessment: post-procedure vital signs reviewed and stable Respiratory status: spontaneous breathing Cardiovascular status: blood pressure returned to baseline Postop Assessment: no headache Anesthetic complications: no    Last Vitals:  Filed Vitals:   05/05/15 1424 05/05/15 1435  BP:  132/85  Pulse: 67 77  Temp:    Resp: 19 17    Last Pain:  Filed Vitals:   05/05/15 1437  PainSc: 3                  Bronson Bressman M

## 2015-05-05 NOTE — Progress Notes (Signed)
Patient is alert and oriented x 4, admitted to room 241 with a diagnosis of acute cholecystitis.  Patient oriented to staff, call bell/ascom. No respiratory distress noted. Patient was medicated with Morphine 4 mg PRN for RUQ pain. Patient is resting quietly on  on re-assessment, respirations even and unlabored. Fall Dispensing optician. Tele box verified with Wallace Keller. Skin assessment done with Alisa S. as well. No open area noted except scratches to her face. Patient is kept NPO. Will continue to monitor.

## 2015-05-05 NOTE — Progress Notes (Signed)
*  PRELIMINARY RESULTS* Echocardiogram 2D Echocardiogram has been performed.  Marie Fowler 05/05/2015, 6:10 PM

## 2015-05-05 NOTE — Progress Notes (Signed)
[facility] [place] [facility]   PATIENT NAME: Marie Fowler    MR#:  [id number]  DATE OF BIRTH:  12-28-1985  SUBJECTIVE:  CHIEF COMPLAINT:   Chief Complaint  Patient presents with  . Abdominal Pain  . Chest Pain  In pain at surgical site, somewhat nauseous.  REVIEW OF SYSTEMS:  Review of Systems  Constitutional: Negative for fever, weight loss, malaise/fatigue and diaphoresis.  HENT: Negative for ear discharge, ear pain, hearing loss, nosebleeds, sore throat and tinnitus.   Eyes: Negative for blurred vision and pain.  Respiratory: Negative for cough, hemoptysis, shortness of breath and wheezing.   Cardiovascular: Negative for chest pain, palpitations, orthopnea and leg swelling.  Gastrointestinal: Positive for nausea and abdominal pain. Negative for heartburn, vomiting, diarrhea, constipation and blood in stool.  Genitourinary: Negative for dysuria, urgency and frequency.  Musculoskeletal: Negative for myalgias and back pain.  Skin: Negative for itching and rash.  Neurological: Negative for dizziness, tingling, tremors, focal weakness, seizures, weakness and headaches.  Psychiatric/Behavioral: Negative for depression. The patient is not nervous/anxious.    DRUG ALLERGIES:  No Known Allergies VITALS:  Blood pressure 110/60, pulse 57, temperature 97.9 F (36.6 C), temperature source Oral, resp. rate 17, height  (1.651 m), weight 112.946 kg (249 lb), last menstrual period 05/01/2015, SpO2 92 %. PHYSICAL EXAMINATION:  Physical Exam  Constitutional: She is oriented to person, place, and time and well-developed, well-nourished, and in no distress.  HENT:  Head: Normocephalic and atraumatic.  Eyes: Conjunctivae and EOM are normal. Pupils are equal, round, and reactive to light.  Neck: Normal range of motion. Neck supple. No tracheal deviation present. No thyromegaly present.  Cardiovascular: Normal rate and regular rhythm.   Murmur  heard. Pulmonary/Chest: Effort normal and breath sounds normal. No respiratory distress. She has no wheezes. She exhibits no tenderness.  Abdominal: Soft. Bowel sounds are normal. She exhibits no distension. There is tenderness (at surgical site).  Musculoskeletal: Normal range of motion.  Neurological: She is alert and oriented to person, place, and time. No cranial nerve deficit.  Skin: Skin is warm and dry. No rash noted.  Psychiatric: Mood and affect normal.   LABORATORY PANEL:   CBC  Recent Labs Lab 05/05/15 0138  WBC 13.4*  HGB 12.8  HCT 38.4  PLT 240   ------------------------------------------------------------------------------------------------------------------ Chemistries   Recent Labs Lab 05/04/15 1802 05/05/15 0138  NA 140 139  K 4.0 3.7  CL 107 109  CO2 25 23  GLUCOSE 111* 128*  BUN 14 11  CREATININE 0.89 0.79  CALCIUM 9.6 8.3*  AST 20  --   ALT 27  --   ALKPHOS 78  --   BILITOT 0.9  --    RADIOLOGY:  Dg Chest 2 View  05/04/2015  CLINICAL DATA:  Left upper quadrant, left lateral chest pain today, smoker EXAM: CHEST  2 VIEW COMPARISON:  01/28/2013 FINDINGS: The heart size and mediastinal contours are within normal limits. Both lungs are clear. The visualized skeletal structures are unremarkable. IMPRESSION: No active cardiopulmonary disease. Electronically Signed   By: Esperanza Heir M.D.   On: 05/04/2015 18:28   Ct Abdomen Pelvis W Contrast  05/04/2015  CLINICAL DATA:  Diffuse abdominal pain extending to the chest, sensation of fecal urgency, vomiting with diarrhea EXAM: CT ABDOMEN AND PELVIS WITH CONTRAST TECHNIQUE: Multidetector CT imaging of the abdomen and pelvis was performed using the standard protocol following bolus administration of intravenous contrast. CONTRAST:  OMNIPAQUE IOHEXOL 300 MG/ML  SOLN COMPARISON:  10/12/2012 FINDINGS: Lower chest: Mild hazy attenuation in a mosaic pattern at both lung bases likely atelectasis. Subpleural 4 mm  nodule on the left stable from 2014 and therefore considered to be benign. Hepatobiliary: Multiple gallstones. Evidence of gallbladder wall thickening and possible mild pericholecystic inflammation. Liver is normal. No significant biliary dilatation. Pancreas: Negative Spleen: Negative Adrenals/Urinary Tract: Negative Stomach/Bowel: Normal.  No significant fecal retention. Vascular/Lymphatic: Normal Reproductive: Normal Other: No free fluid Musculoskeletal: No acute findings IMPRESSION: Gallstones with evidence of possible cholecystitis. Suggest right upper quadrant ultrasound to evaluate further. Electronically Signed   By: Esperanza Heir M.D.   On: 05/04/2015 21:57   US Abdomen Limited Ruq  05/05/2015  CLINICAL DATA:  Cholelithiasis and gallbladder wall thickening on CT. EXAM: US ABDOMEN LIMITED - RIGHT UPPER QUADRANT COMPARISON:  05/04/2015 FINDINGS: Gallbladder: Ultrasound confirms considerable wall thickening of the gallbladder up to 1 cm, with para cholecystic fluid and tendinous immediately over the gallbladder compatible with sonographic Murphy sign. Multiple gallstones are present measuring up to 1.5 cm in diameter, some hemo bile in the gallbladder neck. Common bile duct: Diameter: 5 mm diameter, questionable echogenic debris distally but without a definite well shown stone. Liver: No focal lesion identified. Within normal limits in parenchymal echogenicity. Incidental trace fluid in Morison's pouch. IMPRESSION: 1. Extensive gallbladder wall thickening with cholelithiasis, pericholecystic fluid, and sonographic Murphy sign present. Constellation of findings strongly favors acute cholecystitis, correlate with clinical presentation. 2. Minimally dilated common bile duct with questionable echogenic debris distally, but no definite choledocholithiasis seen. 3. Trace fluid in Morison's pouch. Electronically Signed   By: Gaylyn Rong M.D.   On: 05/05/2015 09:18   ASSESSMENT AND PLAN:   * Acute  cholecystitis -s/p Lap choly and doing well postop. continue Cipro and Flagyl. Likely d/c in am per surgery if doing well.  * Bradycardia - likely vagal response due to pain  * Nausea and vomiting - PRN antiemetics     All the records are reviewed and case discussed with Care Management/Social Worker. Management plans discussed with the patient, family and they are in agreement.  CODE STATUS: FULL CODE  TOTAL TIME TAKING CARE OF THIS PATIENT: 35 minutes.   More than 50% of the time was spent in counseling/coordination of care: YES  POSSIBLE D/C IN AM, DEPENDING ON CLINICAL CONDITION per primary team.   I have transferred service to [facility] after d/w Dr Tonita Cong as there are no active medical problems. He's accepted the patient and is planning to D/C in am IF Doing well.  Will sign off. Please call us with any questions.   [facility], Johnaton Sonneborn M.D on 05/05/2015 at 5:09 PM  Between 7am to 6pm - Pager - [phone]  After 6pm go to [email or web address] - password [email or web address] [facility]  [facility]  Office  [phone]  CC: Primary care physician; No PCP Per Patient  Note: This dictation was prepared with Dragon dictation along with smaller phrase technology. Any transcriptional errors that result from this process are unintentional.

## 2015-05-06 ENCOUNTER — Encounter: Payer: Self-pay | Admitting: General Surgery

## 2015-05-06 MED ORDER — OXYCODONE-ACETAMINOPHEN 7.5-325 MG PO TABS
2.0000 | ORAL_TABLET | ORAL | Status: DC | PRN
  Administered 2015-05-06 (×2): 2 via ORAL
  Filled 2015-05-06 (×2): qty 2

## 2015-05-06 MED ORDER — OXYCODONE-ACETAMINOPHEN 7.5-325 MG PO TABS
2.0000 | ORAL_TABLET | ORAL | Status: DC | PRN
Start: 2015-05-06 — End: 2015-05-21

## 2015-05-06 NOTE — Progress Notes (Signed)
Pt's ride is here, however patient is complaining that she still does not feel good because of the pain and gas feeling. Says she wants to speak with MD before going home. Dr. Tonita Cong notified, stated he will come around to speak with patient again shortly.

## 2015-05-06 NOTE — Progress Notes (Signed)
Pt. Discharge papers prepared, waiting for ride.

## 2015-05-06 NOTE — Progress Notes (Signed)
Dr. Tonita Cong has seen patient - ok to proceed with discharge. Patient given discharge teaching and paperwork regarding medications, diet, follow-up appointments and activity. Patient understanding verbalized. No complaints at this time. IV and telemetry discontinued prior to leaving. Skin assessment as previously charted and vitals are stable; on room air. Patient being discharged to home. Caregiver/family present during discharge teaching. . Prescription printed and given to patient.

## 2015-05-06 NOTE — Discharge Summary (Signed)
Patient ID: DAILA ELBERT MRN: [id number] DOB/AGE: Dec 01, 1985 30 y.o.  Admit date: 05/04/2015 Discharge date: 05/06/2015  Discharge Diagnoses:  Cholecystitis  Procedures Performed: Laparoscopic cholecystectomy  Discharged Condition: good  Hospital Course: Patient admitted from the emergency department with acute cholecystitis and symptomatic bradycardia. Cleared by cardiology for taken to the operative room. Underwent an uneventful laparoscopic cholecystectomy. Able to be discharged home postop day #1 after tolerating a regular diet and having pain controlled on oral medications.  Discharge Orders:  discharge home Okay to shower No heavy lifting for 2 weeks  Disposition: 01-Home or Self Care  Discharge Medications:    Medication List    TAKE these medications        oxyCODONE-acetaminophen 7.5-325 MG tablet  Commonly known as:  PERCOCET  Take 2 tablets by mouth every 4 (four) hours as needed for moderate pain or severe pain.         Follwup: Follow-up Information    Follow up with [facility]. Schedule an appointment as soon as possible for a visit in 2 weeks.   Specialty:  General Surgery   Why:  postop   Contact information:   [address] [place] [address] [phone]      Signed: Ricarda Frame 05/06/2015, 1:21 PM

## 2015-05-06 NOTE — Discharge Instructions (Signed)
Laparoscopic Cholecystectomy, Care After °Refer to this sheet in the next few weeks. These instructions provide you with information about caring for yourself after your procedure. Your health care provider may also give you more specific instructions. Your treatment has been planned according to current medical practices, but problems sometimes occur. Call your health care provider if you have any problems or questions after your procedure. °WHAT TO EXPECT AFTER THE PROCEDURE °After your procedure, it is common to have: °· Pain at your incision sites. You will be given pain medicines to control your pain. °· Mild nausea or vomiting. This should improve after the first 24 hours. °· Bloating and possible shoulder pain from the gas that was used during the procedure. This will improve after the first 24 hours. °HOME CARE INSTRUCTIONS °Incision Care °· Follow instructions from your health care provider about how to take care of your incisions. Make sure you: °¨ Wash your hands with soap and water before you change your bandage (dressing). If soap and water are not available, use hand sanitizer. °¨ Change your dressing as told by your health care provider. °¨ Leave stitches (sutures), skin glue, or adhesive strips in place. These skin closures may need to be in place for 2 weeks or longer. If adhesive strip edges start to loosen and curl up, you may trim the loose edges. Do not remove adhesive strips completely unless your health care provider tells you to do that. °· Do not take baths, swim, or use a hot tub until your health care provider approves. Ask your health care provider if you can take showers. You may only be allowed to take sponge baths for bathing. °General Instructions °· Take over-the-counter and prescription medicines only as told by your health care provider. °· Do not drive or operate heavy machinery while taking prescription pain medicine. °· Return to your normal diet as told by your health care  provider. °· Do not lift anything that is heavier than 10 lb (4.5 kg). °· Do not play contact sports for one week or until your health care provider approves. °SEEK MEDICAL CARE IF:  °· You have redness, swelling, or pain at the site of your incision. °· You have fluid, blood, or pus coming from your incision. °· You notice a bad smell coming from your incision area. °· Your surgical incisions break open. °· You have a fever. °SEEK IMMEDIATE MEDICAL CARE IF: °· You develop a rash. °· You have difficulty breathing. °· You have chest pain. °· You have increasing pain in your shoulders (shoulder strap areas). °· You faint or have dizzy episodes while you are standing. °· You have severe pain in your abdomen. °· You have nausea or vomiting that lasts for more than one day. °  °This information is not intended to replace advice given to you by your health care provider. Make sure you discuss any questions you have with your health care provider. °  °Document Released: 03/29/2005 Document Revised: 12/18/2014 Document Reviewed: 11/08/2012 °Elsevier Interactive Patient Education ©2016 [employer] ° °

## 2015-05-06 NOTE — Progress Notes (Signed)
SUBJECTIVE: Patient still has pain at the surgical site.    Filed Vitals:   05/05/15 1454 05/05/15 1957 05/05/15 2317 05/06/15 0438  BP: 110/60 123/62 115/64 100/60  Pulse: 57 54 48 47  Temp: 97.9 F (36.6 C) 97.9 F (36.6 C)  97.9 F (36.6 C)  TempSrc: Oral Oral  Oral  Resp: Height:      Weight:      SpO2: 92% 93%  96%    Intake/Output Summary (Last 24 hours) at 05/06/15 0846 Last data filed at 05/06/15 0440  Gross per 24 hour  Intake 2027.5 ml  Output   2230 ml  Net -202.5 ml    LABS: Basic Metabolic Panel:  Recent Labs  04/54/09 1802 05/05/15 0138  NA 140 139  K 4.0 3.7  CL 107 109  CO2 25 23  GLUCOSE 111* 128*  BUN 14 11  CREATININE 0.89 0.79  CALCIUM 9.6 8.3*   Liver Function Tests:  Recent Labs  05/04/15 1802  AST 20  ALT 27  ALKPHOS 78  BILITOT 0.9  PROT 7.9  ALBUMIN 4.4    Recent Labs  05/04/15 1802  LIPASE 23   CBC:  Recent Labs  05/04/15 1802 05/05/15 0138  WBC 14.4* 13.4*  HGB 14.0 12.8  HCT 42.3 38.4  MCV 83.3 83.9  PLT 253 240   Cardiac Enzymes:  Recent Labs  05/05/15 0138 05/05/15 0730 05/05/15 1516  TROPONINI <0.03 <0.03 <0.03   BNP: Invalid input(s): POCBNP D-Dimer: No results for input(s): DDIMER in the last 72 hours. Hemoglobin A1C: No results for input(s): HGBA1C in the last 72 hours. Fasting Lipid Panel: No results for input(s): CHOL, HDL, LDLCALC, TRIG, CHOLHDL, LDLDIRECT in the last 72 hours. Thyroid Function Tests: No results for input(s): TSH, T4TOTAL, T3FREE, THYROIDAB in the last 72 hours.  Invalid input(s): FREET3 Anemia Panel: No results for input(s): VITAMINB12, FOLATE, FERRITIN, TIBC, IRON, RETICCTPCT in the last 72 hours.   PHYSICAL EXAM General: Well developed, well nourished, in no acute distress HEENT:  Normocephalic and atramatic Neck:  No JVD.  Lungs: Clear bilaterally to auscultation and percussion. Heart: HRRR . Normal S1 and S2 without gallops or murmurs.  Abdomen:  Bowel sounds are positive, abdomen soft and non-tender  Msk:  Back normal, normal gait. Normal strength and tone for age. Extremities: No clubbing, cyanosis or edema.   Neuro: Alert and oriented X 3. Psych:  Good affect, responds appropriately  TELEMETRY: Not on monitor  ASSESSMENT AND PLAN: S/P cholecystectomy and sinus bradycardia most likely due to vasovagal response secondary to abdominal pain. At this time the heart rate on examination is at least 50-55. Will sign off.  Principal Problem:   Acute cholecystitis Active Problems:   Bradycardia   Nausea and vomiting    Arnol Mcgibbon A, MD, [facility] 05/06/2015 8:46 AM

## 2015-05-06 NOTE — Final Progress Note (Signed)
1 Day Post-Op   Subjective:  Patient's primary complaint is of right shoulder pain. States her pain she had preoperatively is gone. Does have some abdominal pain at her incision site. Tolerated a regular diet and states that the oral medications given to her do help with the pain.  Vital signs in last 24 hours: Temp:  [97.9 F (36.6 C)-100 F (37.8 C)] 98.2 F (36.8 C) (01/24 1151) Pulse Rate:  [47-86] 57 (01/24 1153) Resp:  [11-20] 18 (01/24 1153) BP: (100-137)/(54-91) 108/54 mmHg (01/24 1153) SpO2:  [92 %-100 %] 99 % (01/24 1153) Last BM Date: 05/04/15  Intake/Output from previous day: 01/23 0701 - 01/24 0700 In: 2027.5 [I.V.:1727.5; IV Piggyback:300] Out: 2230 [Urine:2200; Blood:30]  GI: Abdomen soft, appropriately tender to palpation at incision sites, dressings are clean, dry, intact to her laparoscopic cholecystectomy sites. No evidence of erythema or peritonitis.  Lab Results:  CBC  Recent Labs  05/04/15 1802 05/05/15 0138  WBC 14.4* 13.4*  HGB 14.0 12.8  HCT 42.3 38.4  PLT 253 240   CMP     Component Value Date/Time   NA 139 05/05/2015 0138   NA 142 03/30/2013 1918   K 3.7 05/05/2015 0138   K 3.7 03/30/2013 1918   CL 109 05/05/2015 0138   CL 113* 03/30/2013 1918   CO2 23 05/05/2015 0138   CO2 22 03/30/2013 1918   GLUCOSE 128* 05/05/2015 0138   GLUCOSE 110* 03/30/2013 1918   BUN 11 05/05/2015 0138   BUN 15 03/30/2013 1918   CREATININE 0.79 05/05/2015 0138   CREATININE 0.86 03/30/2013 1918   CALCIUM 8.3* 05/05/2015 0138   CALCIUM 8.8 03/30/2013 1918   PROT 7.9 05/04/2015 1802   PROT 6.9 03/30/2013 1918   ALBUMIN 4.4 05/04/2015 1802   ALBUMIN 3.5 03/30/2013 1918   AST 20 05/04/2015 1802   AST 23 03/30/2013 1918   ALT 27 05/04/2015 1802   ALT 22 03/30/2013 1918   ALKPHOS 78 05/04/2015 1802   ALKPHOS 77 03/30/2013 1918   BILITOT 0.9 05/04/2015 1802   BILITOT 0.4 03/30/2013 1918   GFRNONAA >60 05/05/2015 0138   GFRNONAA >60 03/30/2013 1918   GFRNONAA >60 04/15/2011 1131   GFRAA >60 05/05/2015 0138   GFRAA >60 03/30/2013 1918   GFRAA >60 04/15/2011 1131   PT/INR No results for input(s): LABPROT, INR in the last 72 hours.  Studies/Results: Dg Chest 2 View  05/04/2015  CLINICAL DATA:  Left upper quadrant, left lateral chest pain today, smoker EXAM: CHEST  2 VIEW COMPARISON:  01/28/2013 FINDINGS: The heart size and mediastinal contours are within normal limits. Both lungs are clear. The visualized skeletal structures are unremarkable. IMPRESSION: No active cardiopulmonary disease. Electronically Signed   By: Esperanza Heir M.D.   On: 05/04/2015 18:28   Ct Abdomen Pelvis W Contrast  05/04/2015  CLINICAL DATA:  Diffuse abdominal pain extending to the chest, sensation of fecal urgency, vomiting with diarrhea EXAM: CT ABDOMEN AND PELVIS WITH CONTRAST TECHNIQUE: Multidetector CT imaging of the abdomen and pelvis was performed using the standard protocol following bolus administration of intravenous contrast. CONTRAST:  OMNIPAQUE IOHEXOL 300 MG/ML  SOLN COMPARISON:  10/12/2012 FINDINGS: Lower chest: Mild hazy attenuation in a mosaic pattern at both lung bases likely atelectasis. Subpleural 4 mm nodule on the left stable from 2014 and therefore considered to be benign. Hepatobiliary: Multiple gallstones. Evidence of gallbladder wall thickening and possible mild pericholecystic inflammation. Liver is normal. No significant biliary dilatation. Pancreas: Negative Spleen: Negative Adrenals/Urinary  Tract: Negative Stomach/Bowel: Normal.  No significant fecal retention. Vascular/Lymphatic: Normal Reproductive: Normal Other: No free fluid Musculoskeletal: No acute findings IMPRESSION: Gallstones with evidence of possible cholecystitis. Suggest right upper quadrant ultrasound to evaluate further. Electronically Signed   By: Esperanza Heir M.D.   On: 05/04/2015 21:57   US Abdomen Limited Ruq  05/05/2015  CLINICAL DATA:  Cholelithiasis and  gallbladder wall thickening on CT. EXAM: US ABDOMEN LIMITED - RIGHT UPPER QUADRANT COMPARISON:  05/04/2015 FINDINGS: Gallbladder: Ultrasound confirms considerable wall thickening of the gallbladder up to 1 cm, with para cholecystic fluid and tendinous immediately over the gallbladder compatible with sonographic Murphy sign. Multiple gallstones are present measuring up to 1.5 cm in diameter, some hemo bile in the gallbladder neck. Common bile duct: Diameter: 5 mm diameter, questionable echogenic debris distally but without a definite well shown stone. Liver: No focal lesion identified. Within normal limits in parenchymal echogenicity. Incidental trace fluid in Morison's pouch. IMPRESSION: 1. Extensive gallbladder wall thickening with cholelithiasis, pericholecystic fluid, and sonographic Murphy sign present. Constellation of findings strongly favors acute cholecystitis, correlate with clinical presentation. 2. Minimally dilated common bile duct with questionable echogenic debris distally, but no definite choledocholithiasis seen. 3. Trace fluid in Morison's pouch. Electronically Signed   By: Gaylyn Rong M.D.   On: 05/05/2015 09:18    Assessment/Plan: 30 year old female status post laparoscopic cholecystectomy for acute cholecystitis. Doing well. Discussed appropriate activities in the immediate postoperative period. Appreciate medicine and cardiology consultation for her bradycardia. Plan for discharge home. Follow-up in clinic in 1-2 weeks.   Ricarda Frame, MD FACS General Surgeon  05/06/2015

## 2015-05-07 ENCOUNTER — Telehealth: Payer: Self-pay

## 2015-05-07 LAB — SURGICAL PATHOLOGY

## 2015-05-07 NOTE — Telephone Encounter (Signed)
Post discharge call to patient made at this time. Pain is controlled at this time. It was very difficult to understand patient as her words were very slurred. I did understand that she is still feeling weak but this is getting better and that she is holding food down without difficulty. No questions or concerns.   Confirmed patient appointment scheduled. Encouraged patient to call with any questions that arise prior to appointment.

## 2015-05-07 NOTE — Telephone Encounter (Signed)
Post-discharge call made to patient at this time. No answer. Left voicemail for return phone call.  

## 2015-05-16 ENCOUNTER — Telehealth: Payer: Self-pay

## 2015-05-16 NOTE — Telephone Encounter (Signed)
FMLA Form from [employer] has been filled out and faxed.

## 2015-05-21 ENCOUNTER — Encounter: Payer: Self-pay | Admitting: Surgery

## 2015-05-21 ENCOUNTER — Ambulatory Visit (INDEPENDENT_AMBULATORY_CARE_PROVIDER_SITE_OTHER): Payer: BLUE CROSS/BLUE SHIELD | Admitting: Surgery

## 2015-05-21 VITALS — BP 125/87 | HR 78 | Temp 98.3°F | Ht 65.0 in | Wt 243.0 lb

## 2015-05-21 DIAGNOSIS — K810 Acute cholecystitis: Secondary | ICD-10-CM

## 2015-05-21 NOTE — Progress Notes (Signed)
30 year old female status post laparoscopic cholecystectomy for acute cholecystitis. Patient doing well states she is having some diarrhea occasionally. Patient states it depends on what she eats whether she has diarrhea. Patient has no nausea or vomiting no fever or chills. Patient states her energy is back to normal and she would like to return to work.  Filed Vitals:   05/21/15 1128  BP: 125/87  Pulse: 78  Temp: 98.3 F (36.8 C)   PE:  Gen: NAD Abd: soft, non-tender, incisions c/d/i  A/P:  Patient healing well status post laparoscopic cholecystectomy. I reviewed her pathology of acute cholecystitis with cholelithiasis but no dysplasia or malignancy. Patient may return to work and will give her a work excuse note

## 2015-09-25 ENCOUNTER — Emergency Department
Admission: EM | Admit: 2015-09-25 | Discharge: 2015-09-25 | Disposition: A | Discharge status: Home / Self Care | Payer: BLUE CROSS/BLUE SHIELD | Attending: Emergency Medicine | Admitting: Emergency Medicine

## 2015-09-25 DIAGNOSIS — F17210 Nicotine dependence, cigarettes, uncomplicated: Secondary | ICD-10-CM | POA: Insufficient documentation

## 2015-09-25 DIAGNOSIS — N938 Other specified abnormal uterine and vaginal bleeding: Secondary | ICD-10-CM | POA: Insufficient documentation

## 2015-09-25 DIAGNOSIS — N939 Abnormal uterine and vaginal bleeding, unspecified: Secondary | ICD-10-CM | POA: Diagnosis present

## 2015-09-25 LAB — COMPREHENSIVE METABOLIC PANEL
ALBUMIN: 4.1 g/dL (ref 3.5–5.0)
ALT: 30 U/L (ref 14–54)
ANION GAP: 9 (ref 5–15)
AST: 24 U/L (ref 15–41)
Alkaline Phosphatase: 79 U/L (ref 38–126)
BILIRUBIN TOTAL: 0.5 mg/dL (ref 0.3–1.2)
BUN: 16 mg/dL (ref 6–20)
CHLORIDE: 110 mmol/L (ref 101–111)
CO2: 21 mmol/L — AB (ref 22–32)
Calcium: 9 mg/dL (ref 8.9–10.3)
Creatinine, Ser: 0.97 mg/dL (ref 0.44–1.00)
GFR calc Af Amer: >60 mL/min (ref >60)
GFR calc non Af Amer: >60 mL/min (ref >60)
GLUCOSE: 88 mg/dL (ref 65–99)
POTASSIUM: 4 mmol/L (ref 3.5–5.1)
Sodium: 140 mmol/L (ref 135–145)
Total Protein: 7.2 g/dL (ref 6.5–8.1)

## 2015-09-25 LAB — URINALYSIS COMPLETE WITH MICROSCOPIC (ARMC ONLY)
Bilirubin Urine: NEGATIVE
GLUCOSE, UA: NEGATIVE mg/dL
Ketones, ur: NEGATIVE mg/dL
Leukocytes, UA: NEGATIVE
NITRITE: NEGATIVE
PROTEIN: NEGATIVE mg/dL
SPECIFIC GRAVITY, URINE: 1.017 (ref 1.005–1.030)
pH: 5 (ref 5.0–8.0)

## 2015-09-25 LAB — CBC
HEMATOCRIT: 39.2 % (ref 35.0–47.0)
HEMOGLOBIN: 13.3 g/dL (ref 12.0–16.0)
MCH: 28 pg (ref 26.0–34.0)
MCHC: 33.9 g/dL (ref 32.0–36.0)
MCV: 82.5 fL (ref 80.0–100.0)
Platelets: 231 10*3/uL (ref 150–440)
RBC: 4.75 MIL/uL (ref 3.80–5.20)
RDW: 13.3 % (ref 11.5–14.5)
WBC: 9.9 10*3/uL (ref 3.6–11.0)

## 2015-09-25 LAB — PREGNANCY, URINE: PREG TEST UR: NEGATIVE

## 2015-09-25 LAB — LIPASE, BLOOD: LIPASE: 25 U/L (ref 11–51)

## 2015-09-25 NOTE — ED Notes (Signed)
Pt reports heavy vaginal bleeding for past two days. Reports that her period started 3 weeks early.  Also having left flank pain.

## 2015-09-25 NOTE — ED Provider Notes (Signed)
[facility] Emergency Department Provider Note  ____________________________________________    I have reviewed the triage vital signs and the nursing notes.   HISTORY  Chief Complaint Vaginal Bleeding; Flank Pain; and Diarrhea    HPI Marie Fowler is a 30 y.o. female who presents with complaints of vaginal bleeding.patient reports 2 days of vaginal bleeding which has been at times heavy. She feels this is early for her period. She has had a tubal ligation in the past. She denies abdominal pain but is having some vague cramping in her left flank which has resolved in the emergency department. She denies dizziness. No fevers or chills. No history of kidney stones     Past Medical History  Diagnosis Date  . H/O tubal ligation   . Preeclampsia   . Preeclampsia     Patient Active Problem List   Diagnosis Date Noted  . Bradycardia 05/04/2015    Past Surgical History  Procedure Laterality Date  . Tubal ligation    . Cesarean section    . Cholecystectomy N/A 05/05/2015    Procedure: LAPAROSCOPIC CHOLECYSTECTOMY;  Surgeon: Ricarda Frame, MD;  Location: [facility] ORS;  Service: General;  Laterality: N/A;    Current Outpatient Rx  Name  Route  Sig  Dispense  Refill  . ibuprofen (ADVIL,MOTRIN) 200 MG tablet   Oral   Take 800 mg by mouth every 6 (six) hours as needed for moderate pain.           Allergies Penicillins  Family History  Problem Relation Age of Onset  . CAD    . Hyperlipidemia Mother   . Heart disease Mother   . Heart attack Mother 17  . Heart disease Maternal Aunt   . Hyperlipidemia Maternal Aunt   . Heart attack Maternal Aunt 40  . Cancer Maternal Grandmother   . Diabetes Paternal Grandmother     Social History Social History  Substance Use Topics  . Smoking status: Current Every Day Smoker -- 1.00 packs/day    Types: Cigarettes  . Smokeless tobacco: Never Used  . Alcohol Use: No    Review of  Systems  Constitutional: Negative for fever. Eyes: Negative for redness ENT: Negative for sore throat Cardiovascular: Negative for chest pain Respiratory: Negative forCough Gastrointestinal: Negative for abdominal pain Genitourinary: Negative for dysuria.Positive vaginal bleeding Musculoskeletal: Negative for back pain. Skin: Negative for rash. Neurological: Negative for focal weakness Psychiatric: no anxiety    ____________________________________________   PHYSICAL EXAM:  VITAL SIGNS: ED Triage Vitals  Enc Vitals Group     BP 09/25/15 1305 132/77 mmHg     Pulse Rate 09/25/15 1305 72     Resp 09/25/15 1305 16     Temp 09/25/15 1305 98.5 F (36.9 C)     Temp Source 09/25/15 1305 Oral     SpO2 09/25/15 1305 100 %     Weight 09/25/15 1305 250 lb (113.399 kg)     Height 09/25/15 1305  (1.651 m)     Head Cir --      Peak Flow --      Pain Score 09/25/15 1312 7     Pain Loc --      Pain Edu? --      Excl. in GC? --      Constitutional: Alert and oriented. Well appearing and in no distress.  Eyes: Conjunctivae are normal. No erythema or injection ENT   Head: Normocephalic and atraumatic.   Mouth/Throat: Mucous membranes are moist. Cardiovascular:  Normal rate, regular rhythm. Normal and symmetric distal pulses are present in the upper extremities. No murmurs or rubs  Respiratory: Normal respiratory effort without tachypnea nor retractions. Breath sounds are clear and equal bilaterally.  Gastrointestinal: Soft and non-tender in all quadrants. No distention. There is no CVA tenderness. Genitourinary: deferred Musculoskeletal: Nontender with normal range of motion in all extremities. No lower extremity tenderness nor edema. Neurologic:  Normal speech and language. No gross focal neurologic deficits are appreciated. Skin:  Skin is warm, dry and intact. No rash noted. Psychiatric: Mood and affect are normal. Patient exhibits appropriate insight and  judgment.  ____________________________________________    LABS (pertinent positives/negatives)  Labs Reviewed  COMPREHENSIVE METABOLIC PANEL - Abnormal; Notable for the following:    CO2 21 (*)    All other components within normal limits  URINALYSIS COMPLETEWITH MICROSCOPIC ([facility] ONLY) - Abnormal; Notable for the following:    Color, Urine YELLOW (*)    APPearance CLEAR (*)    Hgb urine dipstick 3+ (*)    Bacteria, UA RARE (*)    Squamous Epithelial / LPF 0-5 (*)    All other components within normal limits  LIPASE, BLOOD  CBC  PREGNANCY, URINE    ____________________________________________   EKG  ED ECG REPORT I, Jene EveryKINNER, Hatice Bubel, the attending physician, personally viewed and interpreted this ECG.  Date: 09/25/2015 EKG Time: 2:25 PM Rate: 63 Rhythm: normal sinus rhythm QRS Axis: normal Intervals: normal ST/T Wave abnormalities: normal Conduction Disturbances: none Narrative Interpretation: unremarkable   ____________________________________________    RADIOLOGY  None  ____________________________________________   PROCEDURES  Procedure(s) performed: none  Critical Care performed: none  ____________________________________________   INITIAL IMPRESSION / ASSESSMENT AND PLAN / ED COURSE  Pertinent labs & imaging results that were available during my care of the patient were reviewed by me and considered in my medical decision making (see chart for details).  Patient presents with vaginal bleeding. Hemoglobin is normal, pregnancy is negative. Suspect menstruation verses dysfunctional uterine bleeding. Patient well-appearing. No flank pain in the department. Feel she is appropriate for outpatient workup and follow-up with GYN.  ____________________________________________   FINAL CLINICAL IMPRESSION(S) / ED DIAGNOSES  Final diagnoses:  None           Jene Everyobert Tayton Decaire, MD 09/25/15 [phone]

## 2015-09-25 NOTE — Discharge Instructions (Signed)

## 2016-03-23 IMAGING — CT CT ABD-PELV W/ CM
1 of 2 series · 15 of 32 positions shown, 19 images · IV contrast (omnipaque)
Comparison: 10/12/2012

CLINICAL DATA: Diffuse abdominal pain extending to the chest,
sensation of fecal urgency, vomiting with diarrhea

EXAM:
CT ABDOMEN AND PELVIS WITH CONTRAST
TECHNIQUE: Multidetector CT imaging of the abdomen and pelvis was performed
using the standard protocol following bolus administration of
intravenous contrast.
CONTRAST:  125mL OMNIPAQUE IOHEXOL 300 MG/ML  SOLN

[Series 2: routine abd pel with · axial · 0.84mm/px · z∈[-490,-60]mm · 15 of 94 slices shown, 19 images]
[im 4/94  soft-tissue]
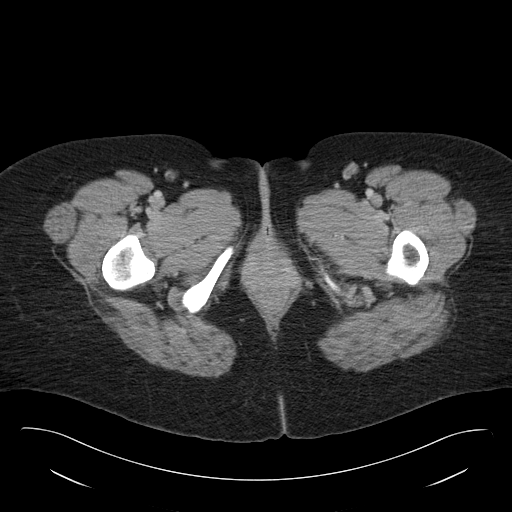
[im 4/94  bone]
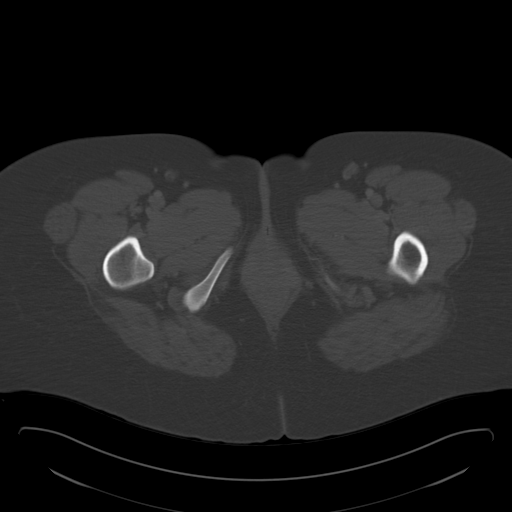
[im 12/94  soft-tissue]
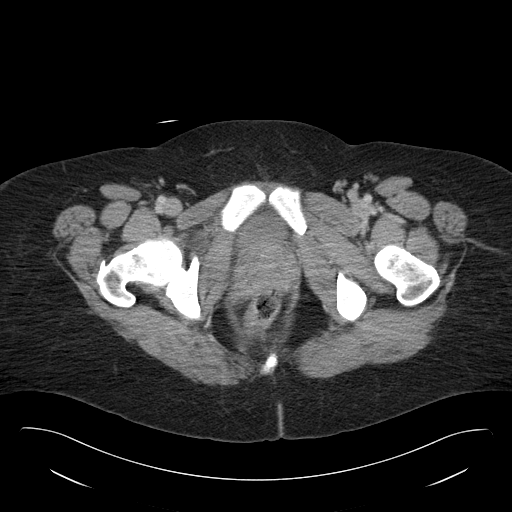
[im 20/94  soft-tissue]
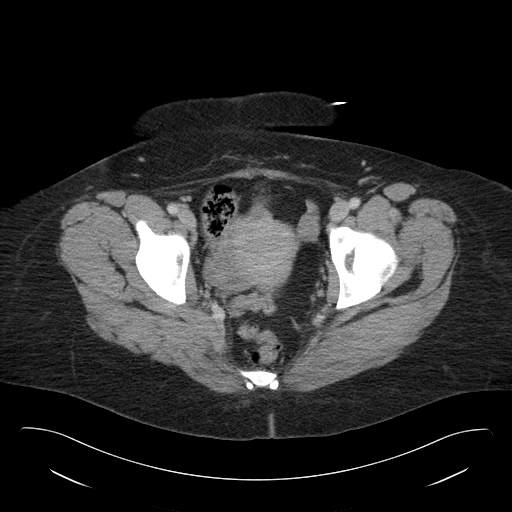
[im 28/94  soft-tissue]
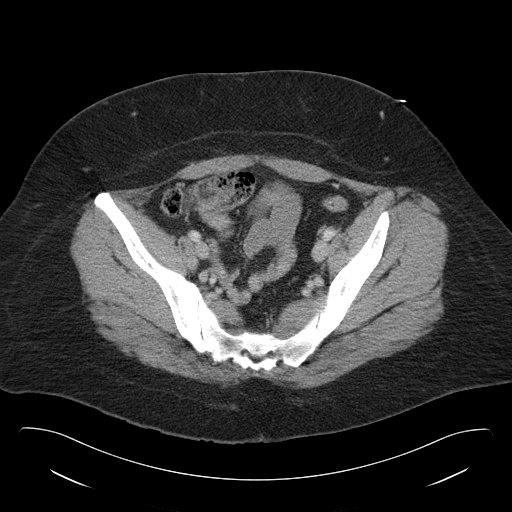
[im 32/94  soft-tissue]
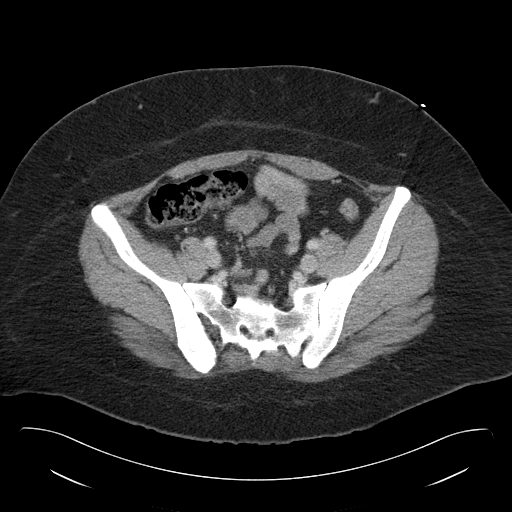
[im 39/94  soft-tissue]
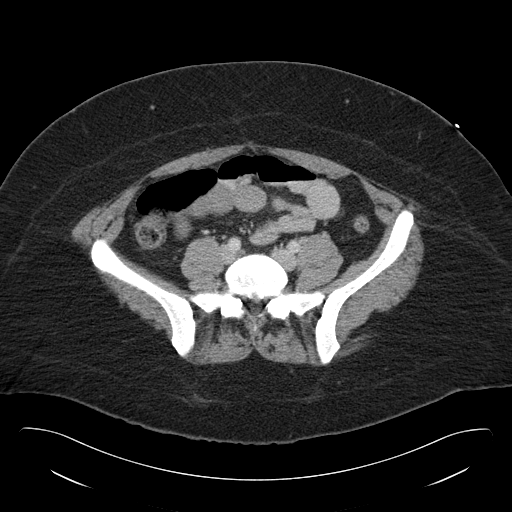
[im 47/94  soft-tissue]
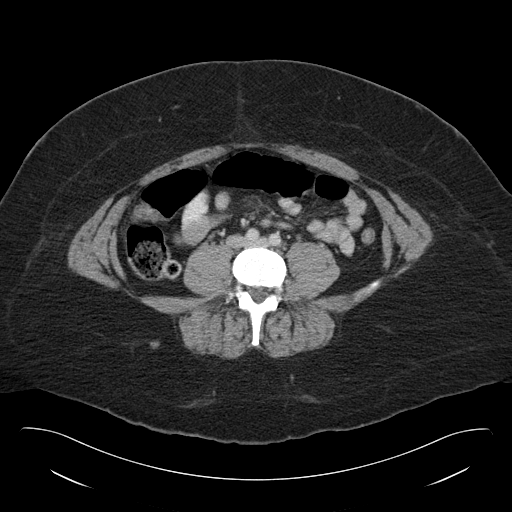
[im 55/94  soft-tissue]
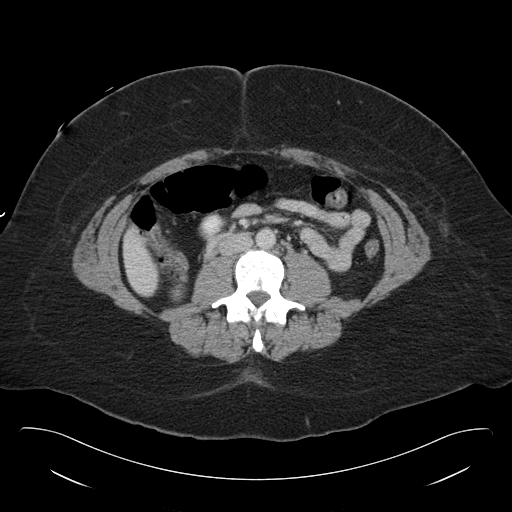
[im 63/94  soft-tissue]
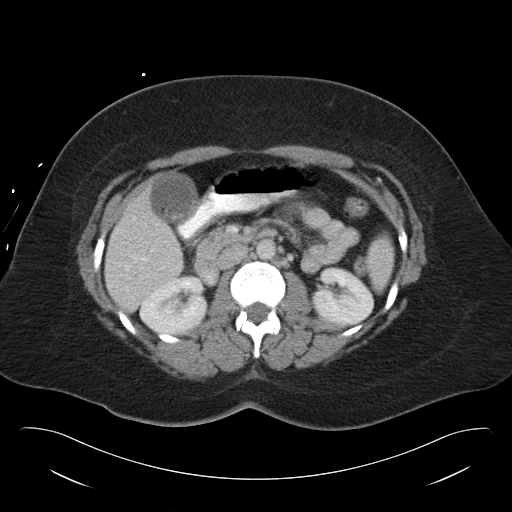
[im 63/94  bone]
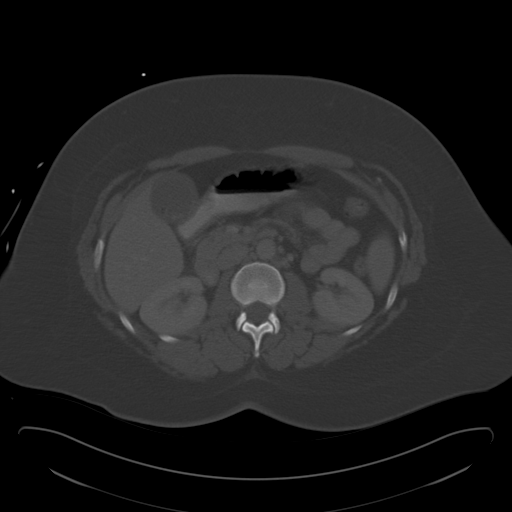
[im 66/94  soft-tissue]
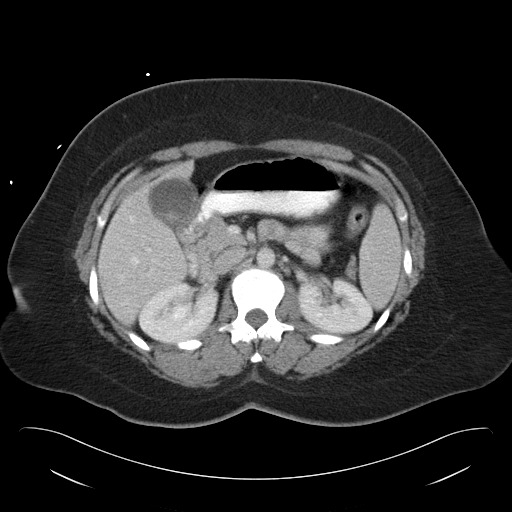
[im 74/94  soft-tissue]
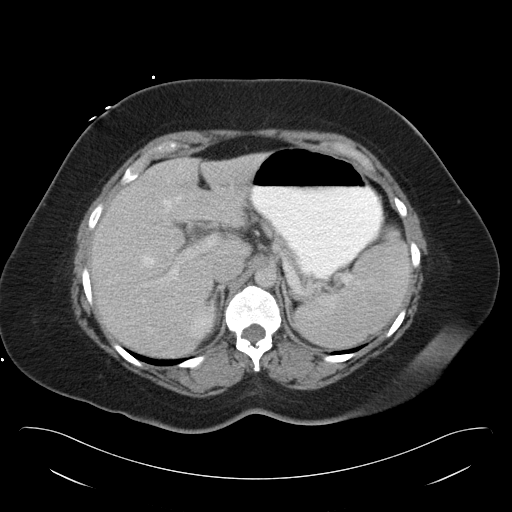
[im 78/94  lung]
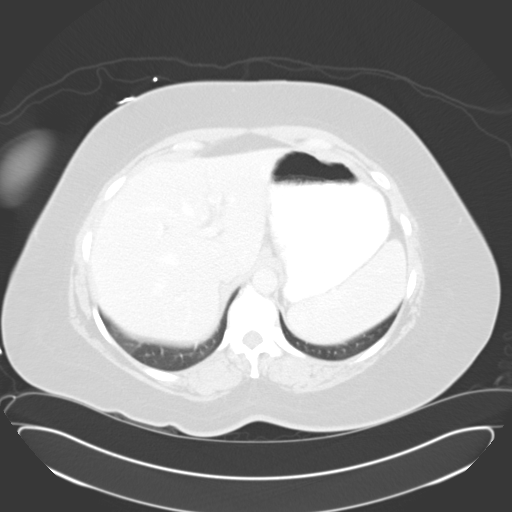
[im 82/94  soft-tissue]
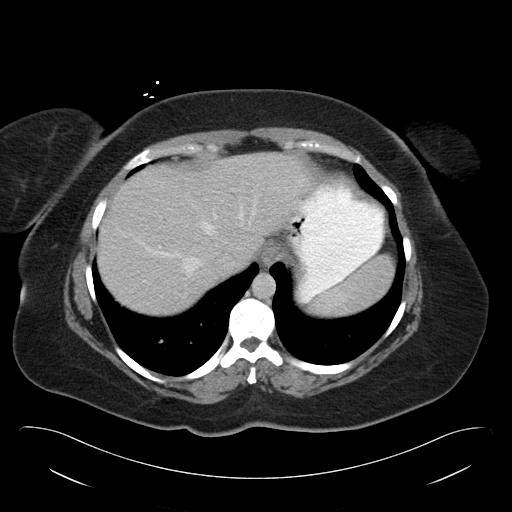
[im 82/94  lung]
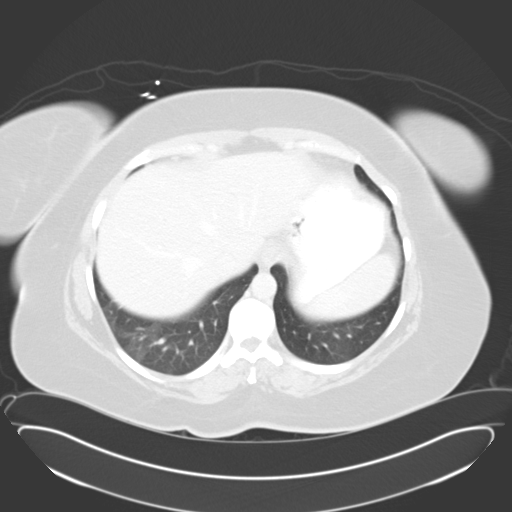
[im 86/94  lung]
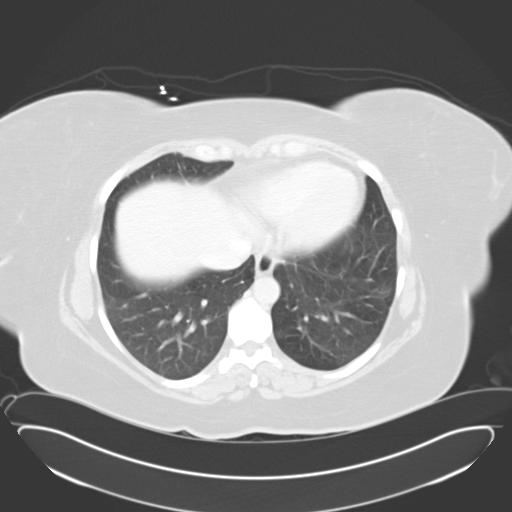
[im 90/94  soft-tissue]
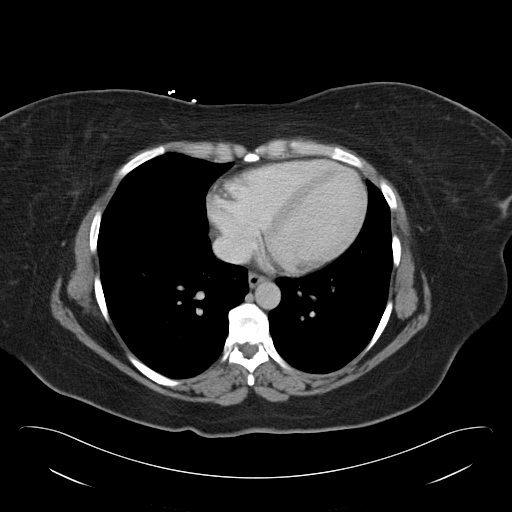
[im 90/94  lung]
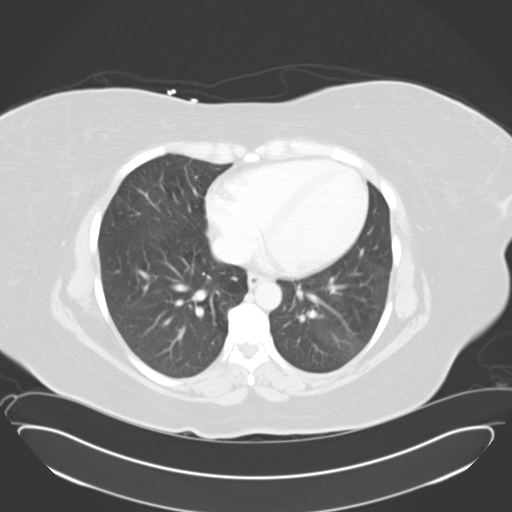

[15 of 32 positions shown; findings below may reference images not displayed]

FINDINGS: Lower chest: Mild hazy attenuation in a mosaic pattern at both lung
bases likely atelectasis. Subpleural 4 mm nodule on the left stable
from 4399 and therefore considered to be benign.

Hepatobiliary: Multiple gallstones. Evidence of gallbladder wall
thickening and possible mild pericholecystic inflammation. Liver is
normal. No significant biliary dilatation.

Pancreas: Negative

Spleen: Negative

Adrenals/Urinary Tract: Negative

Stomach/Bowel: Normal.  No significant fecal retention.

Vascular/Lymphatic: Normal

Reproductive: Normal

Other: No free fluid

Musculoskeletal: No acute findings
IMPRESSION: Gallstones with evidence of possible cholecystitis. Suggest right
upper quadrant ultrasound to evaluate further.

## 2016-11-23 ENCOUNTER — Encounter: Payer: Self-pay | Admitting: Emergency Medicine

## 2016-11-23 ENCOUNTER — Emergency Department
Admission: EM | Admit: 2016-11-23 | Discharge: 2016-11-23 | Disposition: A | Discharge status: Home / Self Care | Payer: BLUE CROSS/BLUE SHIELD | Attending: Emergency Medicine | Admitting: Emergency Medicine

## 2016-11-23 DIAGNOSIS — F17210 Nicotine dependence, cigarettes, uncomplicated: Secondary | ICD-10-CM | POA: Insufficient documentation

## 2016-11-23 DIAGNOSIS — L239 Allergic contact dermatitis, unspecified cause: Secondary | ICD-10-CM | POA: Diagnosis not present

## 2016-11-23 DIAGNOSIS — R21 Rash and other nonspecific skin eruption: Secondary | ICD-10-CM | POA: Diagnosis present

## 2016-11-23 MED ORDER — DEXAMETHASONE SODIUM PHOSPHATE 10 MG/ML IJ SOLN
10.0000 mg | Freq: Once | INTRAMUSCULAR | Status: AC
  Administered 2016-11-23: 10 mg via INTRAMUSCULAR
  Filled 2016-11-23: qty 1

## 2016-11-23 MED ORDER — HYDROXYZINE HCL 50 MG PO TABS: 50.0000 mg | ORAL_TABLET | Freq: Three times a day (TID) | ORAL | 0 refills | Status: DC | PRN

## 2016-11-23 MED ORDER — ALOE VESTA 2-N-1 PROTECTIVE EX OINT: TOPICAL_OINTMENT | Freq: Two times a day (BID) | CUTANEOUS | 0 refills | Status: DC | PRN

## 2016-11-23 MED ORDER — HYDROXYZINE HCL 50 MG PO TABS
50.0000 mg | ORAL_TABLET | Freq: Once | ORAL | Status: DC
  Filled 2016-11-23: qty 1

## 2016-11-23 MED ORDER — METHYLPREDNISOLONE 4 MG PO TBPK: ORAL_TABLET | ORAL | 0 refills | Status: DC

## 2016-11-23 NOTE — ED Provider Notes (Signed)
[facility] Emergency Department Provider Note    ____________________________________________   None    (approximate)  I have reviewed the triage vital signs and the nursing notes.   HISTORY  Chief Complaint Rash    HPI Marie Fowler is a 31 y.o. female patient complaining a rash bilateral ankle which started yesterday. Patient state her rash has a burning sensation. Patient denies contact with plants, laundry products, or change in personal hygiene products.Rash corresponding to the terminal end of the patient socks. Patient rates the pain as a 6/10. No palliative measures for complaint.   Past Medical History:  Diagnosis Date  . H/O tubal ligation   . Preeclampsia   . Preeclampsia     Patient Active Problem List   Diagnosis Date Noted  . Bradycardia 05/04/2015    Past Surgical History:  Procedure Laterality Date  . CESAREAN SECTION    . CHOLECYSTECTOMY N/A 05/05/2015   Procedure: LAPAROSCOPIC CHOLECYSTECTOMY;  Surgeon: Ricarda Frame, MD;  Location: [facility] ORS;  Service: General;  Laterality: N/A;  . TUBAL LIGATION      Prior to Admission medications   Medication Sig Start Date End Date Taking? Authorizing Provider  hydrOXYzine (ATARAX/VISTARIL) 50 MG tablet Take 1 tablet (50 mg total) by mouth 3 (three) times daily as needed. 11/23/16   Joni Reining, PA-C  ibuprofen (ADVIL,MOTRIN) 200 MG tablet Take 800 mg by mouth every 6 (six) hours as needed for moderate pain.    [provider]  methylPREDNISolone (MEDROL DOSEPAK) 4 MG TBPK tablet Take Tapered dose as directed 11/23/16   Joni Reining, PA-C  petrolatum-hydrophilic-aloe vera (ALOE VESTA) ointment Apply topically 2 (two) times daily as needed. 11/23/16   Joni Reining, PA-C    Allergies Penicillins  Family History  Problem Relation Age of Onset  . CAD Unknown   . Hyperlipidemia Mother   . Heart disease Mother   . Heart attack Mother 34  . Heart disease Maternal  Aunt   . Hyperlipidemia Maternal Aunt   . Heart attack Maternal Aunt 40  . Cancer Maternal Grandmother   . Diabetes Paternal Grandmother     Social History Social History  Substance Use Topics  . Smoking status: Current Every Day Smoker    Packs/day: 1.00    Types: Cigarettes  . Smokeless tobacco: Never Used  . Alcohol use No    Review of Systems  Constitutional: No fever/chills Eyes: No visual changes. ENT: No sore throat. Cardiovascular: Denies chest pain. Respiratory: Denies shortness of breath. Gastrointestinal: No abdominal pain.  No nausea, no vomiting.  No diarrhea.  No constipation. Genitourinary: Negative for dysuria. Musculoskeletal: Negative for back pain. Skin: Positive for rash. Neurological: Negative for headaches, focal weakness or numbness.   ____________________________________________   PHYSICAL EXAM:  VITAL SIGNS: ED Triage Vitals [11/23/16 1258]  Enc Vitals Group     BP (!) 141/79     Pulse Rate 66     Resp 16     Temp 98.7 F (37.1 C)     Temp Source Oral     SpO2 98 %     Weight 250 lb (113.4 kg)     Height 5\' 5"  (1.651 m)     Head Circumference      Peak Flow      Pain Score 6     Pain Loc      Pain Edu?      Excl. in GC?     Constitutional: Alert and  oriented. Well appearing and in no acute distress. Cardiovascular: Normal rate, regular rhythm. Grossly normal heart sounds.  Good peripheral circulation. Respiratory: Normal respiratory effort.  No retractions. Lungs CTAB. Gastrointestinal: Soft and nontender. No distention. No abdominal bruits. No CVA tenderness. Musculoskeletal: No lower extremity tenderness nor edema.  No joint effusions. Neurologic:  Normal speech and language. No gross focal neurologic deficits are appreciated. No gait instability. Skin:  Skin is warm, dry and intact. Erythematous macular rash bilateral ankle. Psychiatric: Mood and affect are normal. Speech and behavior are  normal.  ____________________________________________   LABS (all labs ordered are listed, but only abnormal results are displayed)  Labs Reviewed - No data to display ____________________________________________  EKG   ____________________________________________  RADIOLOGY  No results found.  ____________________________________________   PROCEDURES  Procedure(s) performed: None  Procedures  Critical Care performed: No  ____________________________________________   INITIAL IMPRESSION / ASSESSMENT AND PLAN / ED COURSE  Pertinent labs & imaging results that were available during my care of the patient were reviewed by me and considered in my medical decision making (see chart for details).  Contact dermatitis. Patient given discharge care instruction. [facility] if condition persists.      ____________________________________________   FINAL CLINICAL IMPRESSION(S) / ED DIAGNOSES  Final diagnoses:  Allergic contact dermatitis, unspecified trigger      NEW MEDICATIONS STARTED DURING THIS VISIT:  New Prescriptions   HYDROXYZINE (ATARAX/VISTARIL) 50 MG TABLET    Take 1 tablet (50 mg total) by mouth 3 (three) times daily as needed.   METHYLPREDNISOLONE (MEDROL DOSEPAK) 4 MG TBPK TABLET    Take Tapered dose as directed   PETROLATUM-HYDROPHILIC-ALOE VERA (ALOE VESTA) OINTMENT    Apply topically 2 (two) times daily as needed.     Note:  This document was prepared using Dragon voice recognition software and may include unintentional dictation errors.    Joni ReiningSmith, Ronald K, PA-C 11/23/16 1340    Dionne BucySiadecki, Sebastian, MD 11/23/16 [phone]

## 2016-11-23 NOTE — ED Triage Notes (Signed)
Patient presents to the ED with rash bilaterally to both ankles x 2 days.  Patient reports rash burns but does not itch and reports some swelling to ankles as well.  Patient ambulatory to triage in no obvious distress at this time.

## 2018-01-23 IMAGING — US US ABDOMEN LIMITED
1 series · 14 of 25 positions shown · non-contrast
Comparison: 05/04/2015

CLINICAL DATA: Cholelithiasis and gallbladder wall thickening on
CT.

EXAM:
US ABDOMEN LIMITED - RIGHT UPPER QUADRANT

[Series 1: us abdomen limited · 0.28mm/px · 14 of 51 slices shown]
[im 1/51]
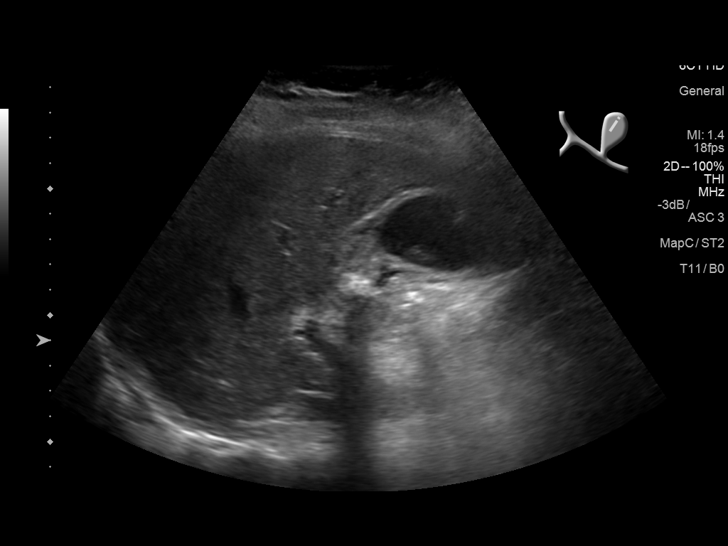
[im 5/51]
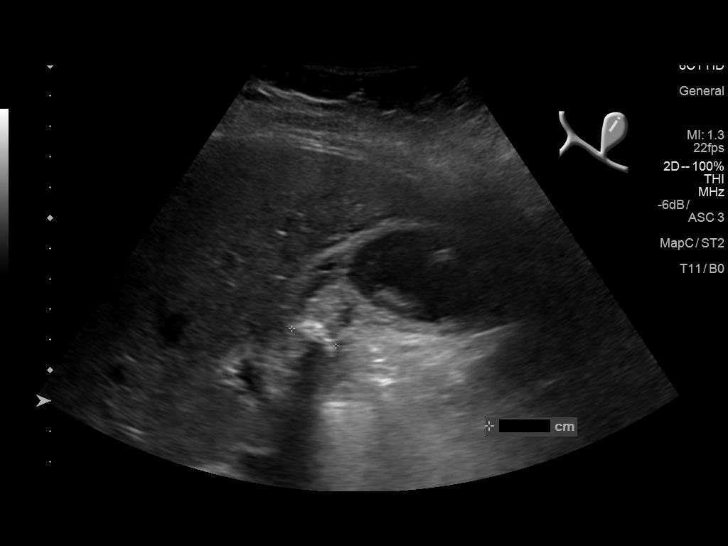
[im 9/51]
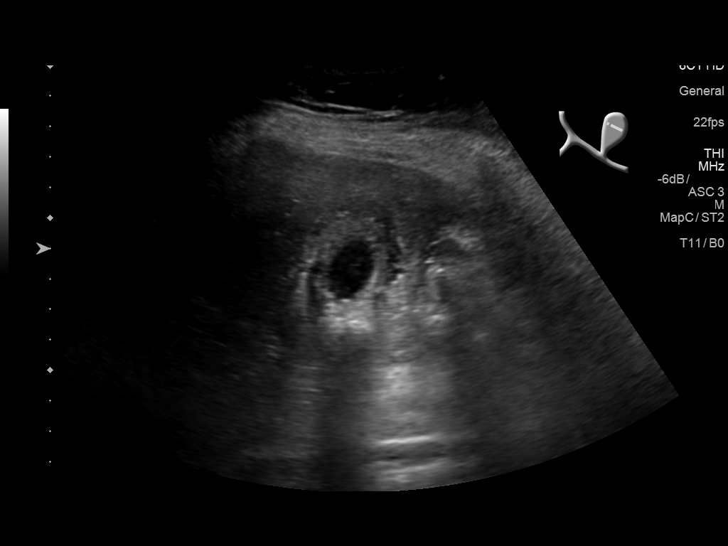
[im 13/51]
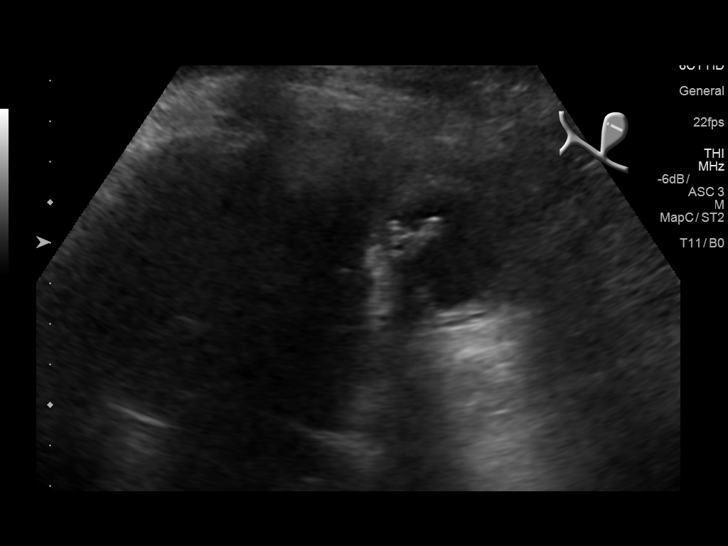
[im 17/51]
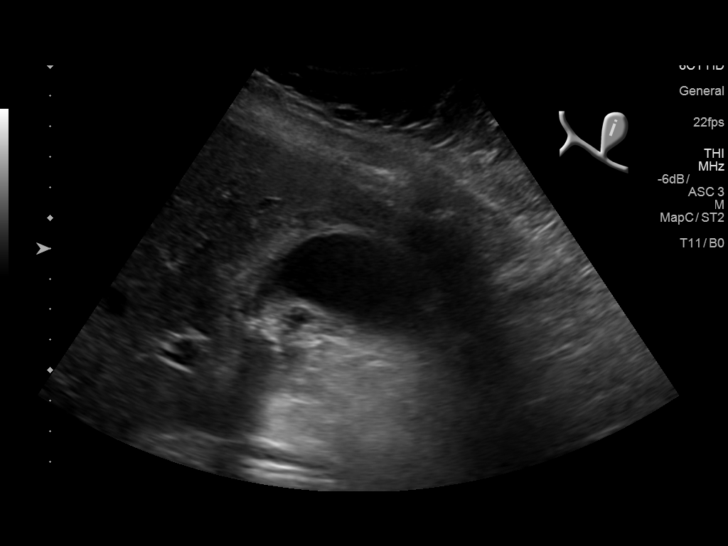
[im 19/51]
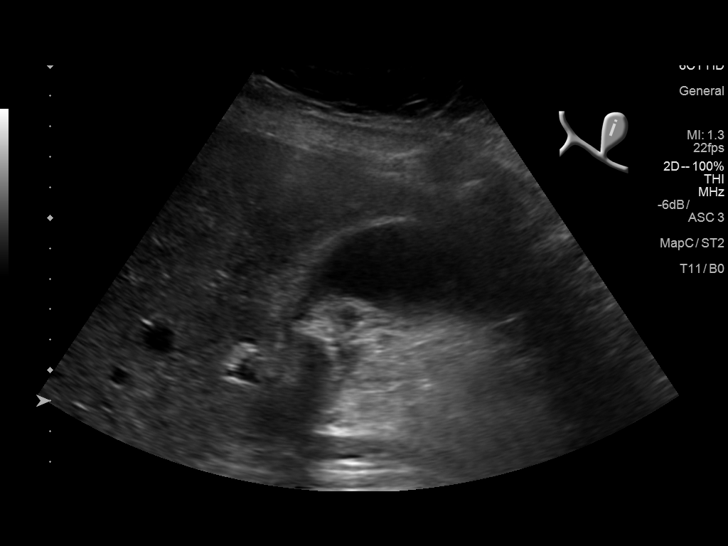
[im 23/51]
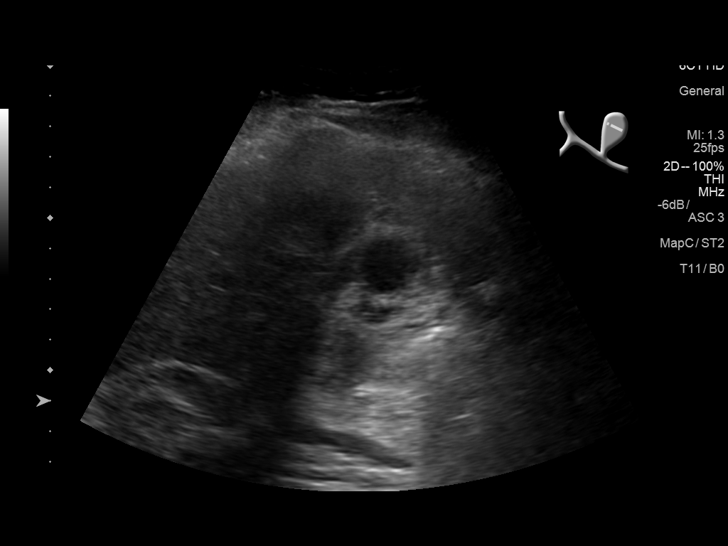
[im 28/51]
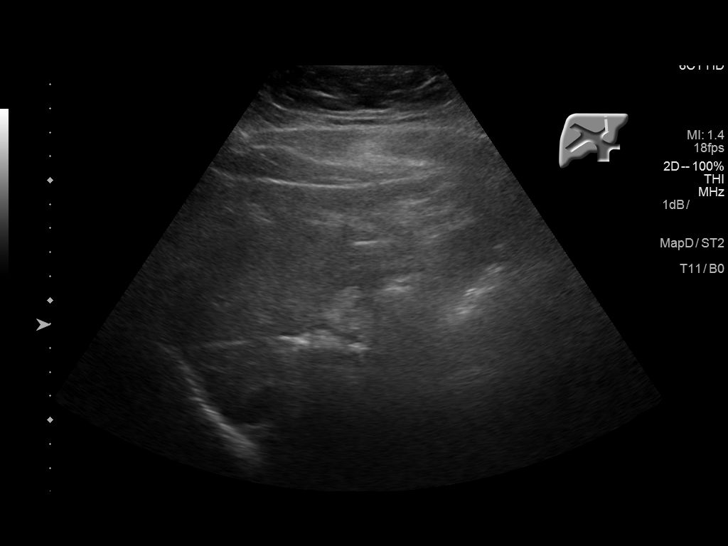
[im 32/51]
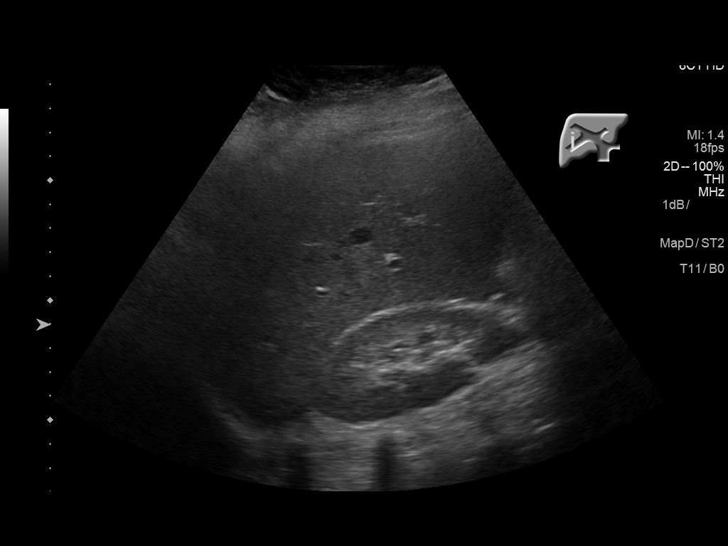
[im 34/51]
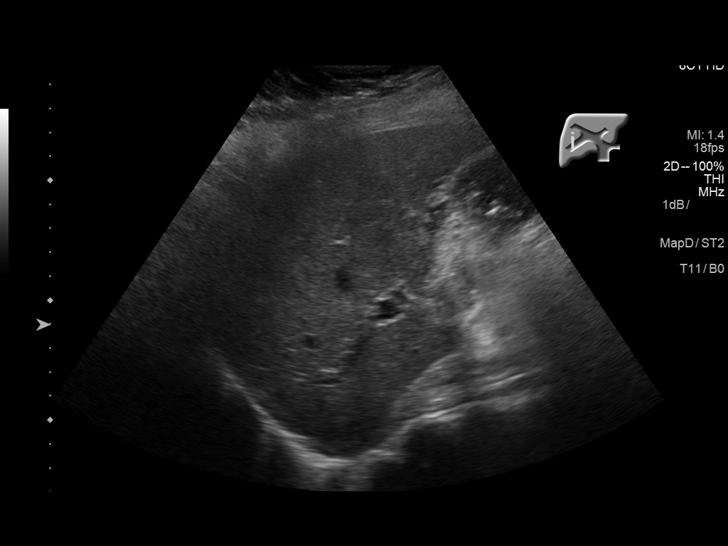
[im 38/51]
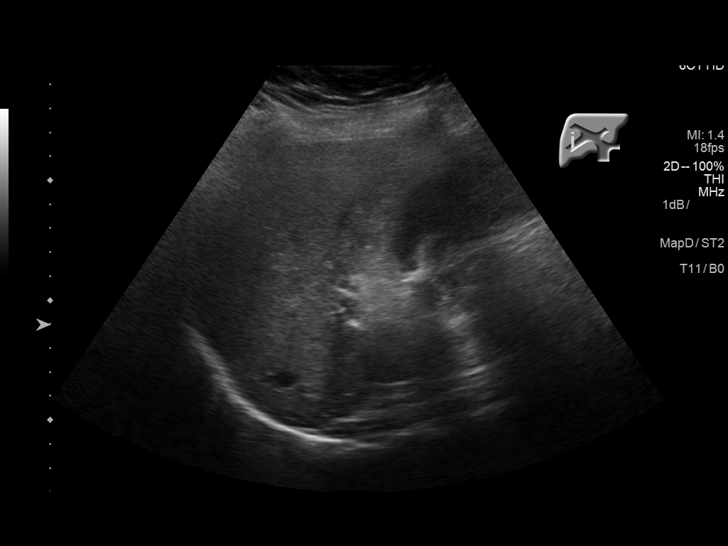
[im 42/51]
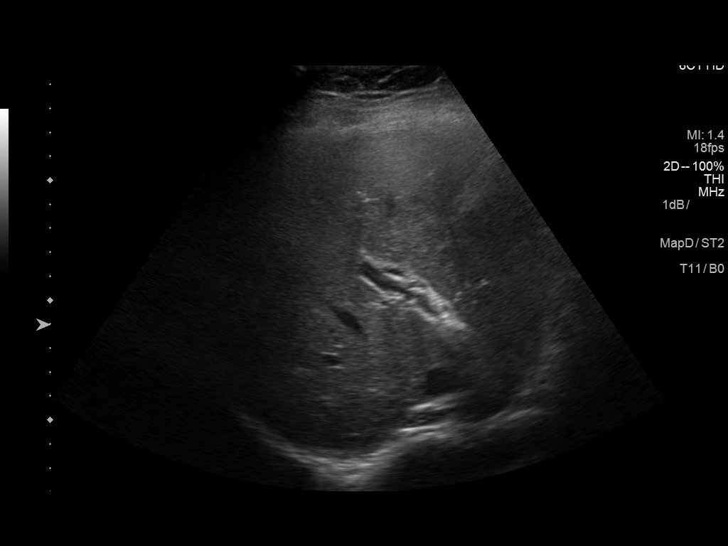
[im 46/51]
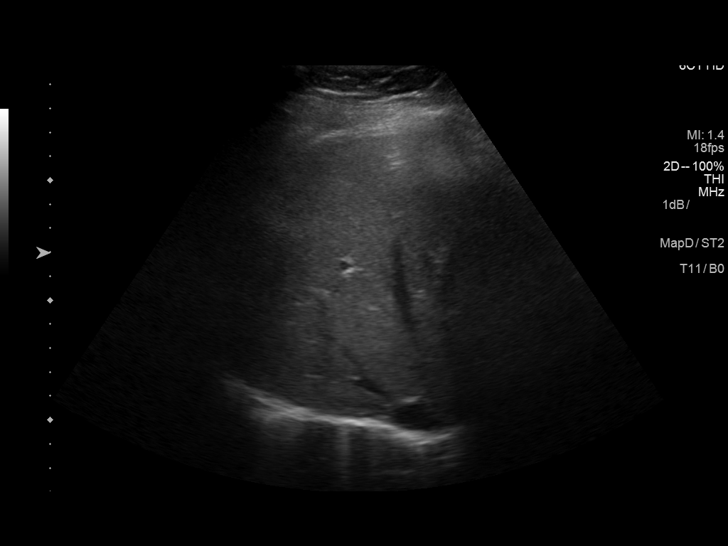
[im 51/51]
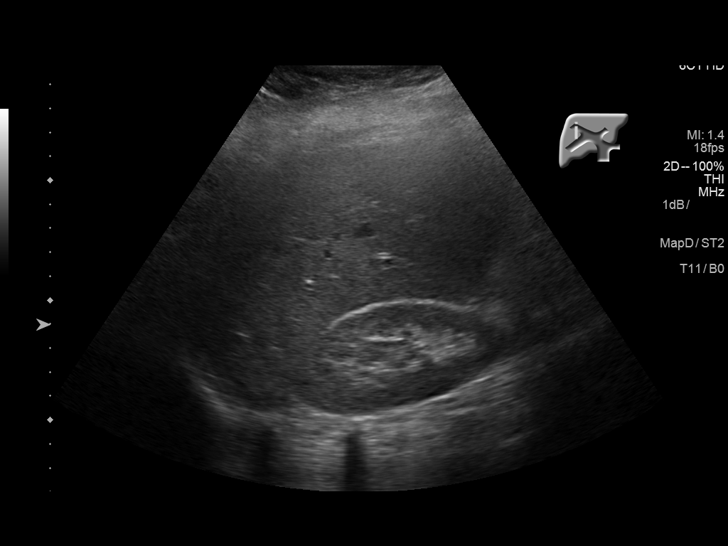

[14 of 25 positions shown; findings below may reference images not displayed]

FINDINGS: Gallbladder:

Ultrasound confirms considerable wall thickening of the gallbladder
up to 1 cm, with para cholecystic fluid and tendinous immediately
over the gallbladder compatible with sonographic Murphy sign.
Multiple gallstones are present measuring up to 1.5 cm in diameter,
some hemo bile in the gallbladder neck.

Common bile duct:

Diameter: 5 mm diameter, questionable echogenic debris distally but
without a definite well shown stone.

Liver:

No focal lesion identified. Within normal limits in parenchymal
echogenicity. Incidental trace fluid in Morison's pouch.
IMPRESSION: 1. Extensive gallbladder wall thickening with cholelithiasis,
pericholecystic fluid, and sonographic Murphy sign present.
Constellation of findings strongly favors acute cholecystitis,
correlate with clinical presentation.
2. Minimally dilated common bile duct with questionable echogenic
debris distally, but no definite choledocholithiasis seen.
3. Trace fluid in Morison's pouch.

## 2018-06-09 ENCOUNTER — Other Ambulatory Visit: Payer: Self-pay

## 2018-06-09 ENCOUNTER — Emergency Department
Admission: EM | Admit: 2018-06-09 | Discharge: 2018-06-10 | Disposition: A | Discharge status: Home / Self Care | Payer: BLUE CROSS/BLUE SHIELD | Attending: Emergency Medicine | Admitting: Emergency Medicine

## 2018-06-09 DIAGNOSIS — F17210 Nicotine dependence, cigarettes, uncomplicated: Secondary | ICD-10-CM | POA: Insufficient documentation

## 2018-06-09 DIAGNOSIS — K5792 Diverticulitis of intestine, part unspecified, without perforation or abscess without bleeding: Secondary | ICD-10-CM

## 2018-06-09 DIAGNOSIS — K5732 Diverticulitis of large intestine without perforation or abscess without bleeding: Secondary | ICD-10-CM | POA: Diagnosis not present

## 2018-06-09 DIAGNOSIS — R109 Unspecified abdominal pain: Secondary | ICD-10-CM | POA: Diagnosis present

## 2018-06-09 LAB — COMPREHENSIVE METABOLIC PANEL
ALBUMIN: 4.3 g/dL (ref 3.5–5.0)
ALT: 42 U/L (ref 0–44)
AST: 26 U/L (ref 15–41)
Alkaline Phosphatase: 84 U/L (ref 38–126)
Anion gap: 8 (ref 5–15)
BILIRUBIN TOTAL: 0.8 mg/dL (ref 0.3–1.2)
BUN: 12 mg/dL (ref 6–20)
CHLORIDE: 108 mmol/L (ref 98–111)
CO2: 22 mmol/L (ref 22–32)
CREATININE: 0.94 mg/dL (ref 0.44–1.00)
Calcium: 8.7 mg/dL — ABNORMAL LOW (ref 8.9–10.3)
GFR calc Af Amer: >60 mL/min (ref >60)
GLUCOSE: 107 mg/dL — AB (ref 70–99)
Potassium: 3.7 mmol/L (ref 3.5–5.1)
Sodium: 138 mmol/L (ref 135–145)
TOTAL PROTEIN: 7.6 g/dL (ref 6.5–8.1)

## 2018-06-09 LAB — CBC
HEMATOCRIT: 41.4 % (ref 36.0–46.0)
HEMOGLOBIN: 13.9 g/dL (ref 12.0–15.0)
MCH: 28.4 pg (ref 26.0–34.0)
MCHC: 33.6 g/dL (ref 30.0–36.0)
MCV: 84.7 fL (ref 80.0–100.0)
PLATELETS: 248 10*3/uL (ref 150–400)
RBC: 4.89 MIL/uL (ref 3.87–5.11)
RDW: 13.2 % (ref 11.5–15.5)
WBC: 12.9 10*3/uL — AB (ref 4.0–10.5)
nRBC: 0 % (ref 0.0–0.2)

## 2018-06-09 LAB — URINALYSIS, COMPLETE (UACMP) WITH MICROSCOPIC
Bacteria, UA: NONE SEEN
Bilirubin Urine: NEGATIVE
GLUCOSE, UA: NEGATIVE mg/dL
Ketones, ur: NEGATIVE mg/dL
Nitrite: NEGATIVE
PH: 6 (ref 5.0–8.0)
PROTEIN: NEGATIVE mg/dL
Specific Gravity, Urine: 1.027 (ref 1.005–1.030)

## 2018-06-09 LAB — LIPASE, BLOOD: Lipase: 27 U/L (ref 11–51)

## 2018-06-09 LAB — POCT PREGNANCY, URINE: Preg Test, Ur: NEGATIVE

## 2018-06-09 NOTE — ED Triage Notes (Signed)
Patient reports abdominal pain and diarrhea for the past 3 days.

## 2018-06-10 ENCOUNTER — Encounter: Payer: Self-pay | Admitting: Radiology

## 2018-06-10 ENCOUNTER — Telehealth: Payer: Self-pay | Admitting: Emergency Medicine

## 2018-06-10 ENCOUNTER — Emergency Department: Payer: BLUE CROSS/BLUE SHIELD

## 2018-06-10 MED ORDER — METRONIDAZOLE 500 MG PO TABS
500.0000 mg | ORAL_TABLET | Freq: Once | ORAL | Status: AC
  Administered 2018-06-10: 500 mg via ORAL
  Filled 2018-06-10: qty 1

## 2018-06-10 MED ORDER — DOCUSATE SODIUM 100 MG PO CAPS: ORAL_CAPSULE | ORAL | 0 refills | Status: DC

## 2018-06-10 MED ORDER — MORPHINE SULFATE (PF) 4 MG/ML IV SOLN
4.0000 mg | Freq: Once | INTRAVENOUS | Status: AC
  Administered 2018-06-10: 4 mg via INTRAVENOUS
  Filled 2018-06-10: qty 1

## 2018-06-10 MED ORDER — SULFAMETHOXAZOLE-TRIMETHOPRIM 800-160 MG PO TABS
1.0000 | ORAL_TABLET | Freq: Once | ORAL | Status: AC
  Administered 2018-06-10: 1 via ORAL
  Filled 2018-06-10: qty 1

## 2018-06-10 MED ORDER — OXYCODONE-ACETAMINOPHEN 5-325 MG PO TABS: 1.0000 | ORAL_TABLET | Freq: Three times a day (TID) | ORAL | 0 refills | Status: DC | PRN

## 2018-06-10 MED ORDER — IOHEXOL 300 MG/ML  SOLN
100.0000 mL | Freq: Once | INTRAMUSCULAR | Status: AC | PRN
  Administered 2018-06-10: 100 mL via INTRAVENOUS

## 2018-06-10 MED ORDER — OXYCODONE-ACETAMINOPHEN 5-325 MG PO TABS: 2.0000 | ORAL_TABLET | Freq: Four times a day (QID) | ORAL | 0 refills | Status: DC | PRN

## 2018-06-10 MED ORDER — SODIUM CHLORIDE 0.9 % IV BOLUS
500.0000 mL | Freq: Once | INTRAVENOUS | Status: AC
  Administered 2018-06-10: 500 mL via INTRAVENOUS

## 2018-06-10 MED ORDER — ONDANSETRON HCL 4 MG/2ML IJ SOLN
4.0000 mg | INTRAMUSCULAR | Status: AC
  Administered 2018-06-10: 4 mg via INTRAVENOUS
  Filled 2018-06-10: qty 2

## 2018-06-10 MED ORDER — OXYCODONE-ACETAMINOPHEN 5-325 MG PO TABS: 2.0000 | ORAL_TABLET | Freq: Once | ORAL | Status: DC

## 2018-06-10 MED ORDER — SULFAMETHOXAZOLE-TRIMETHOPRIM 800-160 MG PO TABS: 1.0000 | ORAL_TABLET | Freq: Two times a day (BID) | ORAL | 0 refills | Status: AC

## 2018-06-10 MED ORDER — ONDANSETRON 4 MG PO TBDP: ORAL_TABLET | ORAL | 0 refills | Status: DC

## 2018-06-10 MED ORDER — METRONIDAZOLE 500 MG PO TABS: 500.0000 mg | ORAL_TABLET | Freq: Three times a day (TID) | ORAL | 0 refills | Status: AC

## 2018-06-10 NOTE — Discharge Instructions (Addendum)
We believe your symptoms are caused by diverticulitis.  Most of the time this condition (please read through the included information) can be cured with outpatient antibiotics.  Please take the full course of prescribed medication(s) and follow up with the doctors recommended above.  Return to the ED if your abdominal pain worsens or fails to improve, you develop bloody vomiting, bloody diarrhea, you are unable to tolerate fluids due to vomiting, fever greater than 101, or other symptoms that concern you.  Take Percocet as prescribed for severe pain. Do not drink alcohol, drive or participate in any other potentially dangerous activities while taking this medication as it may make you sleepy. Do not take this medication with any other sedating medications, either prescription or over-the-counter. If you were prescribed Percocet or Vicodin, do not take these with acetaminophen (Tylenol) as it is already contained within these medications.   This medication is an opiate (or narcotic) pain medication and can be habit forming.  Use it as little as possible to achieve adequate pain control.  Do not use or use it with extreme caution if you have a history of opiate abuse or dependence.  If you are on a pain contract with your primary care doctor or a pain specialist, be sure to let them know you were prescribed this medication today from the [facility].  This medication is intended for your use only - do not give any to anyone else and keep it in a secure place where nobody else, especially children, have access to it.  It will also cause or worsen constipation, so you may want to consider taking an over-the-counter stool softener while you are taking this medication.  

## 2018-06-10 NOTE — ED Provider Notes (Signed)
[facility] Emergency Department Provider Note  ____________________________________________   First MD Initiated Contact with Patient 06/09/18 2331     (approximate)  I have reviewed the triage vital signs and the nursing notes.   HISTORY  Chief Complaint Abdominal Pain    HPI Marie Fowler is a 33 y.o. female with medical history as listed below which also includes morbid obesity.  She presents for evaluation of gradually worsening lower abdominal pain for the last 3 days accompanied with multiple episodes of diarrhea.  She states she has had at least 8 loose stools over the last 24 hours.  She has had nausea but no vomiting.  She has had decreased oral intake due to loss of appetite but has still been able to drink enough fluids.  She denies fever/chills, chest pain, shortness of breath, and dysuria.  She describes the pain is severe, sharp and cramping, nothing in particular makes it better or worse.     Past Medical History:  Diagnosis Date  . H/O tubal ligation   . Preeclampsia   . Preeclampsia     Patient Active Problem List   Diagnosis Date Noted  . Bradycardia 05/04/2015    Past Surgical History:  Procedure Laterality Date  . CESAREAN SECTION    . CHOLECYSTECTOMY N/A 05/05/2015   Procedure: LAPAROSCOPIC CHOLECYSTECTOMY;  Surgeon: Ricarda Frameharles Woodham, MD;  Location: [facility] ORS;  Service: General;  Laterality: N/A;  . TUBAL LIGATION      Prior to Admission medications   Medication Sig Start Date End Date Taking? Authorizing Provider  docusate sodium (COLACE) 100 MG capsule Take 1 tablet once or twice daily as needed for constipation while taking narcotic pain medicine 06/10/18   Loleta RoseForbach, Aizlyn Schifano, MD  hydrOXYzine (ATARAX/VISTARIL) 50 MG tablet Take 1 tablet (50 mg total) by mouth 3 (three) times daily as needed. 11/23/16   Joni ReiningSmith, Ronald K, PA-C  ibuprofen (ADVIL,MOTRIN) 200 MG tablet Take 800 mg by mouth every 6 (six) hours as needed for moderate  pain.    [provider]  methylPREDNISolone (MEDROL DOSEPAK) 4 MG TBPK tablet Take Tapered dose as directed 11/23/16   Joni ReiningSmith, Ronald K, PA-C  metroNIDAZOLE (FLAGYL) 500 MG tablet Take 1 tablet (500 mg total) by mouth every 8 (eight) hours for 10 days. 06/10/18 06/20/18  Loleta RoseForbach, Macintyre Alexa, MD  ondansetron (ZOFRAN ODT) 4 MG disintegrating tablet Allow 1-2 tablets to dissolve in your mouth every 8 hours as needed for nausea/vomiting 06/10/18   Loleta RoseForbach, Ardine Iacovelli, MD  oxyCODONE-acetaminophen (PERCOCET) 5-325 MG tablet Take 2 tablets by mouth every 6 (six) hours as needed for severe pain. 06/10/18   Loleta RoseForbach, Darnita Woodrum, MD  petrolatum-hydrophilic-aloe vera (ALOE VESTA) ointment Apply topically 2 (two) times daily as needed. 11/23/16   Joni ReiningSmith, Ronald K, PA-C  sulfamethoxazole-trimethoprim (BACTRIM DS,SEPTRA DS) 800-160 MG tablet Take 1 tablet by mouth 2 (two) times daily for 10 days. 06/10/18 06/20/18  Loleta RoseForbach, Prabhleen Montemayor, MD    Allergies Penicillins  Family History  Problem Relation Age of Onset  . CAD Unknown   . Hyperlipidemia Mother   . Heart disease Mother   . Heart attack Mother [id number]  . Heart disease Maternal Aunt   . Hyperlipidemia Maternal Aunt   . Heart attack Maternal Aunt 40  . Cancer Maternal Grandmother   . Diabetes Paternal Grandmother     Social History Social History   Tobacco Use  . Smoking status: Current Every Day Smoker    Packs/day: 1.00    Types: Cigarettes  .  Smokeless tobacco: Never Used  Substance Use Topics  . Alcohol use: No    Alcohol/week: 0.0 standard drinks  . 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 ENT: No sore throat. Cardiovascular: Denies chest pain. Respiratory: Denies shortness of breath. Gastrointestinal: Abdominal pain with nausea and diarrhea as described above Genitourinary: Negative for dysuria. Musculoskeletal: Negative for neck  pain.  Negative for back pain. Integumentary: Negative for rash. Neurological: Negative for headaches, focal weakness or numbness.   ____________________________________________   PHYSICAL EXAM:  VITAL SIGNS: ED Triage Vitals  Enc Vitals Group     BP 06/09/18 1923 129/67     Pulse Rate 06/09/18 1923 83     Resp 06/09/18 1923 18     Temp 06/09/18 1923 98.2 F (36.8 C)     Temp Source 06/09/18 1923 Oral     SpO2 06/09/18 1923 99 %     Weight 06/09/18 1922 113.4 kg (250 lb)     Height 06/09/18 1922 1.651 m (5\' 5" )     Head Circumference --      Peak Flow --      Pain Score 06/09/18 1921 9     Pain Loc --      Pain Edu? --      Excl. in GC? --     Constitutional: Alert and oriented.  Generally well-appearing but she does appear uncomfortable. Eyes: Conjunctivae are normal.  Head: Atraumatic. Nose: No congestion/rhinnorhea. Mouth/Throat: Mucous membranes are moist. Neck: No stridor.  No meningeal signs.   Cardiovascular: Normal rate, regular rhythm. Good peripheral circulation. Grossly normal heart sounds. Respiratory: Normal respiratory effort.  No retractions. Lungs CTAB. Gastrointestinal: Morbid obesity.  Abdomen is soft with diffuse tenderness throughout the lower abdomen, no rebound or guarding. Musculoskeletal: No lower extremity tenderness nor edema. No gross deformities of extremities. Neurologic:  Normal speech and language. No gross focal neurologic deficits are appreciated.  Skin:  Skin is warm, dry and intact. No rash noted. Psychiatric: Mood and affect are normal. Speech and behavior are normal.  ____________________________________________   LABS (all labs ordered are listed, but only abnormal results are displayed)  Labs Reviewed  COMPREHENSIVE METABOLIC PANEL - Abnormal; Notable for the following components:      Result Value   Glucose, Bld 107 (*)    Calcium 8.7 (*)    All other components within normal limits  CBC - Abnormal; Notable for the  following components:   WBC 12.9 (*)    All other components within normal limits  URINALYSIS, COMPLETE (UACMP) WITH MICROSCOPIC - Abnormal; Notable for the following components:   Color, Urine YELLOW (*)    APPearance CLEAR (*)    Hgb urine dipstick MODERATE (*)    Leukocytes,Ua TRACE (*)    All other components within normal limits  LIPASE, BLOOD  POC URINE PREG, ED  POCT PREGNANCY, URINE   ____________________________________________  EKG  ED ECG REPORT I, Loleta Rose, the attending physician, personally viewed and interpreted this ECG.  Date: 06/09/2018 EKG Time: 19: 28 Rate: 79 Rhythm: normal sinus rhythm QRS Axis: normal Intervals: normal ST/T Wave abnormalities: Non-specific ST segment / T-wave changes, but no clear evidence of acute ischemia. Narrative Interpretation: no definitive evidence of acute ischemia; does not meet STEMI criteria.   ____________________________________________  RADIOLOGY   ED MD interpretation: Acute diverticulitis without evidence of perforation or abscess  Official radiology report(s): Ct Abdomen Pelvis W Contrast  Result Date: 06/10/2018 CLINICAL DATA:  33 year old female with abdominal pain. EXAM: CT ABDOMEN AND PELVIS WITH CONTRAST TECHNIQUE: Multidetector CT imaging of the abdomen and pelvis was performed using the standard protocol following bolus administration of intravenous contrast. CONTRAST:  OMNIPAQUE IOHEXOL 300 MG/ML  SOLN COMPARISON:  CT of the abdomen pelvis dated 05/04/2015 FINDINGS: Lower chest: The visualized lung bases are clear. No intra-abdominal free air. Trace free fluid within pelvis. Hepatobiliary: Cholecystectomy. Probable mild fatty infiltration of the liver. No intrahepatic biliary ductal dilatation. Pancreas: Unremarkable. No pancreatic ductal dilatation or surrounding inflammatory changes. Spleen: Normal in size without focal abnormality. Adrenals/Urinary Tract: The adrenal glands are unremarkable. There  is no hydronephrosis on either side. The visualized ureters and urinary bladder appear unremarkable. Stomach/Bowel: There is segmental thickening and inflammatory changes of the distal transverse colon centered at several colonic diverticula most consistent with acute diverticulitis. No drainable fluid collection/abscess or evidence of perforation. There is no bowel obstruction. Normal appendix. Vascular/Lymphatic: No significant vascular findings are present. No enlarged abdominal or pelvic lymph nodes. Reproductive: The uterus and ovaries are grossly unremarkable. No pelvic mass. Other: None Musculoskeletal: No acute or significant osseous findings. IMPRESSION: Acute diverticulitis of the distal transverse colon. No abscess or perforation. Electronically Signed   By: Elgie Collard M.D.   On: 06/10/2018 00:56    ____________________________________________   PROCEDURES   Procedure(s) performed (including Critical Care):  Procedures   ____________________________________________   INITIAL IMPRESSION / MDM / ASSESSMENT AND PLAN / ED COURSE  As part of my medical decision making, I reviewed the following data within the electronic MEDICAL RECORD NUMBER Nursing notes reviewed and incorporated, Labs reviewed , Old chart reviewed, Notes from prior ED visits and [place] Controlled Substance Database       Differential diagnosis includes, but is not limited to, viral enteritis, diverticulitis, appendicitis.  Patient's lab work is notable for a mild leukocytosis, normal metabolic panel including LFTs and lipase, and she has normal and stable vital signs and is afebrile.  She is uncomfortable and I have ordered morphine 4 mg IV and Zofran 4 mg IV as well as a 500 mL normal saline IV bolus.  I will obtain a CT scan with IV contrast for further evaluation.  Anticipate discharge with appropriate antibiotics if she has a bacterial cause of the infection as she is hemodynamically stable.  She agrees with the plan  for further evaluation and work-up.  Clinical Course as of Feb 29 0145  Sat Jun 10, 2018  0134 CT ABDOMEN PELVIS W CONTRAST [CF]  0134 CT scan is consistent with uncomplicated diverticulitis with no free air or abscess.  I updated the patient about the results.  Given her reported allergy to penicillins, I will treat her with the alternative course (recommended by UpToDate) of double strength Bactrim 1 tablet twice daily plus metronidazole 500 mg 3 times daily for 10 days.  She is also comfortable with the plan for outpatient management and I gave her my usual customary diverticulitis return precautions.  CT ABDOMEN PELVIS W CONTRAST [CF]    Clinical Course User Index [CF] Loleta Rose, MD    ____________________________________________  FINAL CLINICAL IMPRESSION(S) / ED DIAGNOSES  Final diagnoses:  Acute diverticulitis     MEDICATIONS GIVEN DURING THIS VISIT:  Medications  sulfamethoxazole-trimethoprim (BACTRIM DS,SEPTRA DS) 800-160 MG per tablet 1 tablet (has no administration in time range)  metroNIDAZOLE (FLAGYL) tablet 500 mg (has no administration in  time range)  morphine 4 MG/ML injection 4 mg (4 mg Intravenous Given 06/10/18 0056)  ondansetron (ZOFRAN) injection 4 mg (4 mg Intravenous Given 06/10/18 0056)  sodium chloride 0.9 % bolus 500 mL (500 mLs Intravenous New Bag/Given 06/10/18 0056)  iohexol (OMNIPAQUE) 300 MG/ML solution 100 mL (100 mLs Intravenous Contrast Given 06/10/18 0019)     ED Discharge Orders         Ordered    sulfamethoxazole-trimethoprim (BACTRIM DS,SEPTRA DS) 800-160 MG tablet  2 times daily     06/10/18 0140    metroNIDAZOLE (FLAGYL) 500 MG tablet  Every 8 hours     06/10/18 0140    oxyCODONE-acetaminophen (PERCOCET) 5-325 MG tablet  Every 6 hours PRN     06/10/18 0140    ondansetron (ZOFRAN ODT) 4 MG disintegrating tablet     06/10/18 0140    docusate sodium (COLACE) 100 MG capsule     06/10/18 0140           Note:  This document was  prepared using Dragon voice recognition software and may include unintentional dictation errors.   Loleta Rose, MD 06/10/18 [phone]

## 2018-06-10 NOTE — Telephone Encounter (Signed)
Fixed percocet rx for [facility]

## 2018-06-21 ENCOUNTER — Ambulatory Visit (INDEPENDENT_AMBULATORY_CARE_PROVIDER_SITE_OTHER): Payer: BLUE CROSS/BLUE SHIELD | Admitting: Surgery

## 2018-06-21 ENCOUNTER — Other Ambulatory Visit: Payer: Self-pay

## 2018-06-21 ENCOUNTER — Encounter: Payer: Self-pay | Admitting: Surgery

## 2018-06-21 VITALS — BP 119/80 | HR 70 | Temp 97.7°F | Resp 20 | Ht 65.0 in | Wt 256.6 lb

## 2018-06-21 DIAGNOSIS — K5732 Diverticulitis of large intestine without perforation or abscess without bleeding: Secondary | ICD-10-CM | POA: Diagnosis not present

## 2018-06-21 NOTE — Patient Instructions (Signed)
Patient is to return to the office as needed.  Call the office with any questions or concerns.   Fiber Content in Foods  See the following list for the dietary fiber content of some common foods. High-fiber foods High-fiber foods contain 4 grams or more (4g or more) of fiber per serving. They include:  Artichoke (fresh) - 1 medium has 10.3g of fiber.  Baked beans, plain or vegetarian (canned) -  cup has 5.2g of fiber.  Blackberries or raspberries (fresh) -  cup has 4g of fiber.  Bran cereal -  cup has 8.6g of fiber.  Bulgur (cooked) -  cup has 4g of fiber.  Kidney beans (canned) -  cup has 6.8g of fiber.  Lentils (cooked) -  cup has 7.8g of fiber.  Pear (fresh) - 1 medium has 5.1g of fiber.  Peas (frozen) -  cup has 4.4g of fiber.  Pinto beans (canned) -  cup has 5.5g of fiber.  Pinto beans (dried and cooked) -  cup has 7.7g of fiber.  Potato with skin (baked) - 1 medium has 4.4g of fiber.  Quinoa (cooked) -  cup has 5g of fiber.  Soybeans (canned, frozen, or fresh) -  cup has 5.1g of fiber. Moderate-fiber foods Moderate-fiber foods contain 1-4 grams (1-4g) of fiber per serving. They include:  Almonds - 1 oz. has 3.5g of fiber.  Apple with skin - 1 medium has 3.3g of fiber.  Applesauce, sweetened -  cup has 1.5g of fiber.  Bagel, plain - one 4-inch (10-cm) bagel has 2g of fiber.  Banana - 1 medium has 3.1g of fiber.  Broccoli (cooked) -  cup has 2.5g of fiber.  Carrots (cooked) -  cup has 2.3g of fiber.  Corn (canned or frozen) -  cup has 2.1g of fiber.  Corn tortilla - one 6-inch (15-cm) tortilla has 1.5g of fiber.  Green beans (canned) -  cup has 2g of fiber.  Instant oatmeal -  cup has about 2g of fiber.  Long-grain brown rice (cooked) - 1 cup has 3.5g of fiber.  Macaroni, enriched (cooked) - 1 cup has 2.5g of fiber.  Melon - 1 cup has 1.4g of fiber.  Multigrain cereal -  cup has about 2-4g of fiber.  Orange - 1 small has  3.1g of fiber.  Potatoes, mashed -  cup has 1.6g of fiber.  Raisins - 1/4 cup has 1.6g of fiber.  Squash -  cup has 2.9g of fiber.  Sunflower seeds -  cup has 1.1g of fiber.  Tomato - 1 medium has 1.5g of fiber.  Vegetable or soy patty - 1 has 3.4g of fiber.  Whole-wheat bread - 1 slice has 2g of fiber.  Whole-wheat spaghetti -  cup has 3.2g of fiber. Low-fiber foods Low-fiber foods contain less than 1 gram (less than 1g) of fiber per serving. They include:  Egg - 1 large.  Flour tortilla - one 6-inch (15-cm) tortilla.  Fruit juice -  cup.  Lettuce - 1 cup.  Meat, poultry, or fish - 1 oz.  Milk - 1 cup.  Spinach (raw) - 1 cup.  White bread - 1 slice.  White rice -  cup.  Yogurt -  cup. Actual amounts of fiber in foods may be different depending on processing. Talk with your dietitian about how much fiber you need in your diet. This information is not intended to replace advice given to you by your health care provider. Make sure you discuss any questions you  have with your health care provider. Document Released: 08/15/2006 Document Revised: 09/04/2015 Document Reviewed: 05/22/2015 Elsevier Interactive Patient Education  2019 Elsevier Inc.  Diverticulitis  Diverticulitis is when small pockets in your large intestine (colon) get infected or swollen. This causes stomach pain and watery poop (diarrhea). These pouches are called diverticula. They form in people who have a condition called diverticulosis. Follow these instructions at home: Medicines  Take over-the-counter and prescription medicines only as told by your doctor. These include: ? Antibiotics. ? Pain medicines. ? Fiber pills. ? Probiotics. ? Stool softeners.  Do not drive or use heavy machinery while taking prescription pain medicine.  If you were prescribed an antibiotic, take it as told. Do not stop taking it even if you feel better. General instructions   Follow a diet as told by your  doctor.  When you feel better, your doctor may tell you to change your diet. You may need to eat a lot of fiber. Fiber makes it easier to poop (have bowel movements). Healthy foods with fiber include: ? Berries. ? Beans. ? Lentils. ? Green vegetables.  Exercise 3 or more times a week. Aim for 30 minutes each time. Exercise enough to sweat and make your heart beat faster.  Keep all follow-up visits as told. This is important. You may need to have an exam of the large intestine. This is called a colonoscopy. Contact a doctor if:  Your pain does not get better.  You have a hard time eating or drinking.  You are not pooping like normal. Get help right away if:  Your pain gets worse.  Your problems do not get better.  Your problems get worse very fast.  You have a fever.  You throw up (vomit) more than one time.  You have poop that is: ? Bloody. ? Black. ? Tarry. Summary  Diverticulitis is when small pockets in your large intestine (colon) get infected or swollen.  Take medicines only as told by your doctor.  Follow a diet as told by your doctor. This information is not intended to replace advice given to you by your health care provider. Make sure you discuss any questions you have with your health care provider. Document Released: 09/15/2007 Document Revised: 04/15/2016 Document Reviewed: 04/15/2016 Elsevier Interactive Patient Education  2019 [employer].

## 2018-06-22 ENCOUNTER — Encounter: Payer: Self-pay | Admitting: Surgery

## 2018-06-22 NOTE — Progress Notes (Signed)
Patient ID: Marie Fowler, female   DOB: 1985-09-30, 33 y.o.   MRN: [id number]  HPI Marie Fowler is a 33 y.o. female in consultation at the request of Dr. York Cerise.  She did have a recent episode of acute diverticulitis about 2 weeks ago.  She reported that she has responded very well to antibiotic therapy.  She endorsed that developed pain about 2 weeks ago in the left lower quadrant.  She denies any fevers any chills.  CT scan personally reviewed showing evidence of distal transverse diverticulitis.  Diverticulosis within the sigmoid colon.  There is no evidence of perforation or abscess. BMP and CBC was normal.  She smokes daily but she is able to perform more than 4 METS of activity without any shortness of breath or chest pain.  Her BMI is 42.7 Have a history of cholecystectomy in the past  HPI  Past Medical History:  Diagnosis Date  . H/O tubal ligation   . Preeclampsia   . Preeclampsia     Past Surgical History:  Procedure Laterality Date  . CESAREAN SECTION    . CHOLECYSTECTOMY N/A 05/05/2015   Procedure: LAPAROSCOPIC CHOLECYSTECTOMY;  Surgeon: Ricarda Frame, MD;  Location: [facility] ORS;  Service: General;  Laterality: N/A;  . TUBAL LIGATION      Family History  Problem Relation Age of Onset  . CAD Other   . Hyperlipidemia Mother   . Heart disease Mother   . Heart attack Mother 36  . Heart disease Maternal Aunt   . Hyperlipidemia Maternal Aunt   . Heart attack Maternal Aunt 40  . Cancer Maternal Grandmother   . Diabetes Paternal Grandmother     Social History Social History   Tobacco Use  . Smoking status: Current Every Day Smoker    Packs/day: 1.00    Years: 5.00    Pack years: 5.00    Types: Cigarettes  . Smokeless tobacco: Never Used  Substance Use Topics  . Alcohol use: No    Alcohol/week: 0.0 standard drinks  . 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 Penicillins Hives     Current Outpatient Medications  Medication Sig Dispense Refill  . ibuprofen (ADVIL,MOTRIN) 200 MG tablet Take 800 mg by mouth every 6 (six) hours as needed for moderate pain.     No current facility-administered medications for this visit.      Review of Systems Full ROS  was asked and was negative except for the information on the HPI  Physical Exam Blood pressure 119/80, pulse 70, temperature 97.7 F (36.5 C), temperature source Temporal, resp. rate 20, height 5\' 5"  (1.651 m), weight 256 lb 9.6 oz (116.4 kg), last menstrual period 05/17/2018, SpO2 97 %. CONSTITUTIONAL: NAD , obese female EYES: Pupils are equal, round, and reactive to light, Sclera are non-icteric. EARS, NOSE, MOUTH AND THROAT: The oropharynx is clear. The oral mucosa is pink and moist. Hearing is intact to voice. LYMPH NODES:  Lymph nodes in the neck are normal. RESPIRATORY:  Lungs are clear. There is normal respiratory effort, with equal breath sounds bilaterally, and without pathologic use of accessory muscles. CARDIOVASCULAR: Heart is regular without murmurs, gallops, or rubs. GI: The abdomen is soft, nontender, and nondistended. There are no palpable masses. There is no hepatosplenomegaly. There are normal bowel sounds in all quadrants. GU: Rectal deferred.   MUSCULOSKELETAL: Normal muscle strength and tone. No cyanosis  or edema.   SKIN: Turgor is good and there are no pathologic skin lesions or ulcers. NEUROLOGIC: Motor and sensation is grossly normal. Cranial nerves are grossly intact. PSYCH:  Oriented to person, place and time. Affect is normal.  Data Reviewed  I have personally reviewed the patient's imaging, laboratory findings and medical records.    Assessment/Plan Complicated diverticulitis of the distal transverse colon and clinically likely involving the sigmoid colon as well.  At this time there is no indication for colectomy.  This is the first episode.  I do recommend a colonoscopy at some  point in time she wishes to wait for now.  I extensive discussion with the patient regarding high-fiber diet continuation of antibiotic therapy and also went to my office if the symptoms recur or persist. He understands.  RTC as needed A copy of this report was sent to the referring provider  Sterling Big, MD FACS General Surgeon 06/22/2018, 10:49 AM

## 2021-11-24 ENCOUNTER — Other Ambulatory Visit: Payer: Self-pay

## 2021-11-24 ENCOUNTER — Emergency Department: Payer: BC Managed Care – PPO

## 2021-11-24 DIAGNOSIS — R0789 Other chest pain: Secondary | ICD-10-CM | POA: Diagnosis present

## 2021-11-24 LAB — POC URINE PREG, ED: Preg Test, Ur: NEGATIVE

## 2021-11-24 LAB — CBC
HCT: 37.8 % (ref 36.0–46.0)
Hemoglobin: 12.3 g/dL (ref 12.0–15.0)
MCH: 27 pg (ref 26.0–34.0)
MCHC: 32.5 g/dL (ref 30.0–36.0)
MCV: 82.9 fL (ref 80.0–100.0)
Platelets: 235 10*3/uL (ref 150–400)
RBC: 4.56 MIL/uL (ref 3.87–5.11)
RDW: 13.1 % (ref 11.5–15.5)
WBC: 9.3 10*3/uL (ref 4.0–10.5)
nRBC: 0 % (ref 0.0–0.2)

## 2021-11-24 LAB — BASIC METABOLIC PANEL
Anion gap: 6 (ref 5–15)
BUN: 15 mg/dL (ref 6–20)
CO2: 24 mmol/L (ref 22–32)
Calcium: 9.6 mg/dL (ref 8.9–10.3)
Chloride: 109 mmol/L (ref 98–111)
Creatinine, Ser: 0.89 mg/dL (ref 0.44–1.00)
GFR, Estimated: >60 mL/min (ref >60)
Glucose, Bld: 114 mg/dL — ABNORMAL HIGH (ref 70–99)
Potassium: 3.7 mmol/L (ref 3.5–5.1)
Sodium: 139 mmol/L (ref 135–145)

## 2021-11-24 LAB — TROPONIN I (HIGH SENSITIVITY)
Troponin I (High Sensitivity): 4 ng/L (ref <18)
Troponin I (High Sensitivity): 3 ng/L (ref <18)

## 2021-11-24 NOTE — ED Provider Triage Note (Signed)
  Emergency Medicine Provider Triage Evaluation Note  ZEINAB RODWELL , a 36 y.o.female,  was evaluated in triage.  Pt complains of chest pain started earlier today.  She states that the pain was a 10/10 earlier, but is now a 7/10.  Denies any personal cardiac history.  She states that the pain started under her left breast traveled up to her neck throughout the day.   Review of Systems  Positive: Chest pain Negative: Denies fever, abdominal pain, vomiting  Physical Exam   Vitals:   11/24/21 1637  BP: (!) 148/77  Pulse: 77  Resp: 18  Temp: 98.1 F (36.7 C)  SpO2: 97%   Gen:   Awake, no distress   Resp:  Normal effort  MSK:   Moves extremities without difficulty  Other:    Medical Decision Making  Given the patient's initial medical screening exam, the following diagnostic evaluation has been ordered. The patient will be placed in the appropriate treatment space, once one is available, to complete the evaluation and treatment. I have discussed the plan of care with the patient and I have advised the patient that an ED physician or mid-level practitioner will reevaluate their condition after the test results have been received, as the results may give them additional insight into the type of treatment they may need.    Diagnostics: Labs, EKG, CXR  Treatments: none immediately   Varney Daily, [place] 11/24/21 1734

## 2021-11-24 NOTE — ED Triage Notes (Signed)
Pt via POV from home. Pt c/o L sided chest pain that started under her L breast and travelled up to her neck throughout the day. States she also feel SOB. Denies cardiac hx. Pt is A&OX4 and NAD

## 2021-11-25 ENCOUNTER — Emergency Department
Admission: EM | Admit: 2021-11-25 | Discharge: 2021-11-25 | Disposition: A | Discharge status: Home / Self Care | Payer: BC Managed Care – PPO | Attending: Emergency Medicine | Admitting: Emergency Medicine

## 2021-11-25 DIAGNOSIS — R0789 Other chest pain: Secondary | ICD-10-CM

## 2021-11-25 MED ORDER — KETOROLAC TROMETHAMINE 30 MG/ML IJ SOLN
30.0000 mg | Freq: Once | INTRAMUSCULAR | Status: AC
  Administered 2021-11-25: 30 mg via INTRAMUSCULAR
  Filled 2021-11-25: qty 1

## 2021-11-25 NOTE — Discharge Instructions (Signed)
Please take Tylenol and ibuprofen/Advil for your pain.  It is safe to take them together, or to alternate them every few hours.  Take up to 1000mg of Tylenol at a time, up to 4 times per day.  Do not take more than 4000 mg of Tylenol in 24 hours.  For ibuprofen, take 400-600 mg, 3 - 4 times per day.  

## 2021-11-25 NOTE — ED Provider Notes (Signed)
[facility] Provider Note    Event Date/Time   First MD Initiated Contact with Patient 11/25/21 0220     (approximate)   History   Chest Pain   HPI  Marie Fowler is a 36 y.o. female who presents to the ED for evaluation of Chest Pain   I review 2020 general surgical visit for diverticulitis.  Morbidly obese patient s/p cholecystectomy.  Patient presents to the ED for evaluation of 4 episodes of chest pain that occurred in the past 24 hours.  She reports left-sided cramping pressure that lasts up to a couple hours at a time and self resolves.  Reports feeling pain with a deep inspiration alongside the pain when it happens.  Asymptomatic in between these episodes.  No syncope, no OCPs or history of VTE.   Physical Exam   Triage Vital Signs: ED Triage Vitals  Enc Vitals Group     BP 11/24/21 1637 (!) 148/77     Pulse Rate 11/24/21 1637 77     Resp 11/24/21 1637 18     Temp 11/24/21 1637 98.1 F (36.7 C)     Temp Source 11/24/21 2219 Oral     SpO2 11/24/21 1637 97 %     Weight 11/24/21 1637 280 lb (127 kg)     Height 11/24/21 1637 5\' 5"  (1.651 m)     Head Circumference --      Peak Flow --      Pain Score 11/24/21 1637 8     Pain Loc --      Pain Edu? --      Excl. in GC? --     Most recent vital signs: Vitals:   11/25/21 0209 11/25/21 0300  BP: 117/62 125/82  Pulse: (!) 56 (!) 40  Resp: 12 18  Temp:    SpO2: 99% 99%    General: Awake, no distress.  CV:  Good peripheral perfusion. RRR Resp:  Normal effort.  Abd:  No distention.  MSK:  No deformity noted.  Tenderness to palpation to her left anterior chest at the site of pain that reproduces her symptoms.  No overlying skin changes or signs of trauma. Neuro:  No focal deficits appreciated. Other:     ED Results / Procedures / Treatments   Labs (all labs ordered are listed, but only abnormal results are displayed) Labs Reviewed  BASIC METABOLIC PANEL - Abnormal; Notable for  the following components:      Result Value   Glucose, Bld 114 (*)    All other components within normal limits  CBC  POC URINE PREG, ED  TROPONIN I (HIGH SENSITIVITY)  TROPONIN I (HIGH SENSITIVITY)    EKG Sinus rhythm with a rate of 62 bpm.  Normal axis and intervals.  No evidence of acute ischemia.  RADIOLOGY CXR interpreted by me without evidence of acute cardiopulmonary pathology.  Official radiology report(s): DG Chest 2 View  Result Date: 11/24/2021 CLINICAL DATA:  Chest pain EXAM: CHEST - 2 VIEW COMPARISON:  05/04/2015 FINDINGS: The heart size and mediastinal contours are within normal limits. Both lungs are clear. The visualized skeletal structures are unremarkable. IMPRESSION: No active cardiopulmonary disease. Electronically Signed   By: 05/06/2015 M.D.   On: 11/24/2021 17:10    PROCEDURES and INTERVENTIONS:  .1-3 Lead EKG Interpretation  Performed by: 11/26/2021, MD Authorized by: Delton Prairie, MD     Interpretation: normal     ECG rate:  48   ECG rate  assessment: normal     Rhythm: sinus bradycardia     Ectopy: none     Conduction: normal     Medications  ketorolac (TORADOL) 30 MG/ML injection 30 mg (has no administration in time range)     IMPRESSION / MDM / ASSESSMENT AND PLAN / ED COURSE  I reviewed the triage vital signs and the nursing notes.  Differential diagnosis includes, but is not limited to, ACS, PTX, PNA, muscle strain/spasm, PE, dissection  {Patient presents with symptoms of an acute illness or injury that is potentially life-threatening.  36 year old female presents to the ED with atypical chest pains without evidence of acute pathology and suitable for outpatient management.  Nonischemic EKG and 2 negative troponins.  Not pregnant, normal CBC and BMP.  Clear CXR.  PERC negative and I doubt PE.  Pain is possibly MSK in etiology considering the reproducible nature of her pain and otherwise fairly low risk in nature.  We will provide  anti-inflammatories and discharged with return precaution.     FINAL CLINICAL IMPRESSION(S) / ED DIAGNOSES   Final diagnoses:  Other chest pain     Rx / DC Orders   ED Discharge Orders     None        Note:  This document was prepared using Dragon voice recognition software and may include unintentional dictation errors.   Delton Prairie, MD 11/25/21 [phone]

## 2023-03-03 ENCOUNTER — Emergency Department: Payer: BC Managed Care – PPO

## 2023-03-03 ENCOUNTER — Other Ambulatory Visit: Payer: Self-pay

## 2023-03-03 ENCOUNTER — Emergency Department
Admission: EM | Admit: 2023-03-03 | Discharge: 2023-03-03 | Disposition: A | Discharge status: Home / Self Care | Payer: BC Managed Care – PPO | Attending: Emergency Medicine | Admitting: Emergency Medicine

## 2023-03-03 ENCOUNTER — Encounter: Payer: Self-pay | Admitting: Emergency Medicine

## 2023-03-03 DIAGNOSIS — R109 Unspecified abdominal pain: Secondary | ICD-10-CM | POA: Diagnosis present

## 2023-03-03 DIAGNOSIS — N390 Urinary tract infection, site not specified: Secondary | ICD-10-CM | POA: Insufficient documentation

## 2023-03-03 DIAGNOSIS — D72829 Elevated white blood cell count, unspecified: Secondary | ICD-10-CM | POA: Diagnosis not present

## 2023-03-03 LAB — BASIC METABOLIC PANEL
Anion gap: 11 (ref 5–15)
BUN: 16 mg/dL (ref 6–20)
CO2: 19 mmol/L — ABNORMAL LOW (ref 22–32)
Calcium: 9.2 mg/dL (ref 8.9–10.3)
Chloride: 106 mmol/L (ref 98–111)
Creatinine, Ser: 0.77 mg/dL (ref 0.44–1.00)
GFR, Estimated: >60 mL/min (ref >60)
Glucose, Bld: 136 mg/dL — ABNORMAL HIGH (ref 70–99)
Potassium: 4 mmol/L (ref 3.5–5.1)
Sodium: 136 mmol/L (ref 135–145)

## 2023-03-03 LAB — CBC
HCT: 39.4 % (ref 36.0–46.0)
Hemoglobin: 13.5 g/dL (ref 12.0–15.0)
MCH: 28.2 pg (ref 26.0–34.0)
MCHC: 34.3 g/dL (ref 30.0–36.0)
MCV: 82.3 fL (ref 80.0–100.0)
Platelets: 269 10*3/uL (ref 150–400)
RBC: 4.79 MIL/uL (ref 3.87–5.11)
RDW: 12.9 % (ref 11.5–15.5)
WBC: 11.8 10*3/uL — ABNORMAL HIGH (ref 4.0–10.5)
nRBC: 0 % (ref 0.0–0.2)

## 2023-03-03 LAB — URINALYSIS, ROUTINE W REFLEX MICROSCOPIC
Bilirubin Urine: NEGATIVE
Glucose, UA: NEGATIVE mg/dL
Ketones, ur: NEGATIVE mg/dL
Nitrite: NEGATIVE
Protein, ur: NEGATIVE mg/dL
Specific Gravity, Urine: 1.017 (ref 1.005–1.030)
pH: 5 (ref 5.0–8.0)

## 2023-03-03 LAB — POC URINE PREG, ED: Preg Test, Ur: NEGATIVE

## 2023-03-03 LAB — LIPASE, BLOOD: Lipase: 34 U/L (ref 11–51)

## 2023-03-03 MED ORDER — KETOROLAC TROMETHAMINE 30 MG/ML IJ SOLN
30.0000 mg | Freq: Once | INTRAMUSCULAR | Status: AC
  Administered 2023-03-03: 30 mg via INTRAMUSCULAR
  Filled 2023-03-03: qty 1

## 2023-03-03 MED ORDER — CEPHALEXIN 500 MG PO CAPS: 500.0000 mg | ORAL_CAPSULE | Freq: Two times a day (BID) | ORAL | 0 refills | Status: AC

## 2023-03-03 NOTE — ED Notes (Signed)
See triage notes. Patient c/o left flank pain times one week. Patient stated the pain stopped in her side and went to the left side of her neck for a day then went back to her side.

## 2023-03-03 NOTE — ED Provider Notes (Signed)
[facility] Provider Note    Event Date/Time   First MD Initiated Contact with Patient 03/03/23 [phone]     (approximate)   History   Flank Pain   HPI  Marie Fowler is a 37 y.o. female reticulitis who presents with left-sided flank discomfort which started yesterday, she denies dysuria, reports normal stools.  Does not feel like diverticulitis she had the past because at that time she had diarrhea.  No fevers reported.  No nausea or vomiting     Physical Exam   Triage Vital Signs: ED Triage Vitals [03/03/23 0906]  Encounter Vitals Group     BP (!) 145/80     Systolic BP Percentile      Diastolic BP Percentile      Pulse Rate 84     Resp 18     Temp 98.5 F (36.9 C)     Temp Source Oral     SpO2 94 %     Weight (!) 145.2 kg (320 lb)     Height 1.651 m (5\' 5" )     Head Circumference      Peak Flow      Pain Score 6     Pain Loc      Pain Education      Exclude from Growth Chart     Most recent vital signs: Vitals:   03/03/23 0906  BP: (!) 145/80  Pulse: 84  Resp: 18  Temp: 98.5 F (36.9 C)  SpO2: 94%     General: Awake, no distress.  CV:  Good peripheral perfusion.  Resp:  Normal effort.  Abd:  No distention.  No significant tenderness palpation, no CVA tenderness Other:     ED Results / Procedures / Treatments   Labs (all labs ordered are listed, but only abnormal results are displayed) Labs Reviewed  URINALYSIS, ROUTINE W REFLEX MICROSCOPIC - Abnormal; Notable for the following components:      Result Value   Color, Urine YELLOW (*)    APPearance HAZY (*)    Hgb urine dipstick MODERATE (*)    Leukocytes,Ua TRACE (*)    Bacteria, UA RARE (*)    All other components within normal limits  BASIC METABOLIC PANEL - Abnormal; Notable for the following components:   CO2 19 (*)    Glucose, Bld 136 (*)    All other components within normal limits  CBC - Abnormal; Notable for the following components:   WBC 11.8 (*)     All other components within normal limits  LIPASE, BLOOD  POC URINE PREG, ED     EKG     RADIOLOGY CT renal stone study interpret by me, no ureterolithiasis    PROCEDURES:  Critical Care performed:   Procedures   MEDICATIONS ORDERED IN ED: Medications  ketorolac (TORADOL) 30 MG/ML injection 30 mg (30 mg Intramuscular Given 03/03/23 0944)     IMPRESSION / MDM / ASSESSMENT AND PLAN / ED COURSE  I reviewed the triage vital signs and the nursing notes. Patient's presentation is most consistent with acute presentation with potential threat to life or bodily function.  Patient presents with left lower quadrant/left flank discomfort, differential includes ureterolithiasis, UTI, diverticulitis  Will treat with IM Toradol, obtain labs, CT renal stone   Lab work demonstrates mild elevation in white blood cell count, urinalysis pending  Urinalysis suspicious for early UTI, CT renal stone study overall reassuring, no indication for admission at this time, appropriate for discharge with  outpatient follow-up       FINAL CLINICAL IMPRESSION(S) / ED DIAGNOSES   Final diagnoses:  Lower urinary tract infectious disease     Rx / DC Orders   ED Discharge Orders          Ordered    cephALEXin (KEFLEX) 500 MG capsule  2 times daily        03/03/23 1207             Note:  This document was prepared using Dragon voice recognition software and may include unintentional dictation errors.   Jene Every, MD 03/03/23 1322

## 2023-03-03 NOTE — ED Triage Notes (Signed)
Patient to ED via POV fro left sided flank pain. Ongoing x1 week. Also having nausea and increased BP. 150/96 this AM PTA
